# Patient Record
Sex: Female | Born: 1990 | Race: Black or African American | Hispanic: No | Marital: Single | Smoking: Never smoker
Health system: Southern US, Community
[De-identification: ages and names within clinical notes are randomized; demographics above are authoritative.]

## PROBLEM LIST (undated history)

## (undated) ENCOUNTER — Emergency Department (HOSPITAL_BASED_OUTPATIENT_CLINIC_OR_DEPARTMENT_OTHER): Admission: EM | Payer: Self-pay | Source: Home / Self Care

## (undated) DIAGNOSIS — R011 Cardiac murmur, unspecified: Secondary | ICD-10-CM

## (undated) DIAGNOSIS — I10 Essential (primary) hypertension: Secondary | ICD-10-CM

---

## 2013-10-29 ENCOUNTER — Emergency Department (HOSPITAL_BASED_OUTPATIENT_CLINIC_OR_DEPARTMENT_OTHER)
Admission: EM | Admit: 2013-10-29 | Discharge: 2013-10-29 | Disposition: A | Discharge status: Home / Self Care | Payer: Medicaid Other | Attending: Emergency Medicine | Admitting: Emergency Medicine

## 2013-10-29 ENCOUNTER — Encounter (HOSPITAL_BASED_OUTPATIENT_CLINIC_OR_DEPARTMENT_OTHER): Payer: Self-pay | Admitting: Emergency Medicine

## 2013-10-29 DIAGNOSIS — B3731 Acute candidiasis of vulva and vagina: Secondary | ICD-10-CM | POA: Insufficient documentation

## 2013-10-29 DIAGNOSIS — X503XXA Overexertion from repetitive movements, initial encounter: Secondary | ICD-10-CM | POA: Insufficient documentation

## 2013-10-29 DIAGNOSIS — S39012A Strain of muscle, fascia and tendon of lower back, initial encounter: Secondary | ICD-10-CM

## 2013-10-29 DIAGNOSIS — Z3202 Encounter for pregnancy test, result negative: Secondary | ICD-10-CM | POA: Insufficient documentation

## 2013-10-29 DIAGNOSIS — B373 Candidiasis of vulva and vagina: Secondary | ICD-10-CM

## 2013-10-29 DIAGNOSIS — S335XXA Sprain of ligaments of lumbar spine, initial encounter: Secondary | ICD-10-CM | POA: Insufficient documentation

## 2013-10-29 DIAGNOSIS — R011 Cardiac murmur, unspecified: Secondary | ICD-10-CM | POA: Insufficient documentation

## 2013-10-29 DIAGNOSIS — Y929 Unspecified place or not applicable: Secondary | ICD-10-CM | POA: Insufficient documentation

## 2013-10-29 DIAGNOSIS — Y9389 Activity, other specified: Secondary | ICD-10-CM | POA: Insufficient documentation

## 2013-10-29 HISTORY — DX: Cardiac murmur, unspecified: R01.1

## 2013-10-29 LAB — URINALYSIS, ROUTINE W REFLEX MICROSCOPIC
Bilirubin Urine: NEGATIVE
Glucose, UA: NEGATIVE mg/dL
Hgb urine dipstick: NEGATIVE
KETONES UR: NEGATIVE mg/dL
Nitrite: NEGATIVE
PROTEIN: NEGATIVE mg/dL
Specific Gravity, Urine: 1.005 (ref 1.005–1.030)
UROBILINOGEN UA: 1 mg/dL (ref 0.0–1.0)
pH: 6.5 (ref 5.0–8.0)

## 2013-10-29 LAB — WET PREP, GENITAL
TRICH WET PREP: NONE SEEN
Yeast Wet Prep HPF POC: NONE SEEN

## 2013-10-29 LAB — PREGNANCY, URINE: PREG TEST UR: NEGATIVE

## 2013-10-29 LAB — URINE MICROSCOPIC-ADD ON

## 2013-10-29 MED ORDER — CYCLOBENZAPRINE HCL 10 MG PO TABS: 10.0000 mg | ORAL_TABLET | Freq: Every day | ORAL | Status: DC

## 2013-10-29 MED ORDER — FLUCONAZOLE 50 MG PO TABS
150.0000 mg | ORAL_TABLET | Freq: Once | ORAL | Status: AC
  Administered 2013-10-29: 150 mg via ORAL
  Filled 2013-10-29 (×2): qty 1

## 2013-10-29 NOTE — ED Notes (Signed)
Pt also c/o vaginal d/c started yesterday

## 2013-10-29 NOTE — ED Notes (Signed)
Pt with lower back for several months, has been seen at [facility] several times, states they have not done any xrays, denies any paraesthesia to extremities, no weakness or difficulty ambulating  vaginal discharge started yesterday, + itching, no odor, denies unprotected sex, reports new type of soap.
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
## 2013-10-30 LAB — GC/CHLAMYDIA PROBE AMP
CT Probe RNA: NEGATIVE
GC Probe RNA: NEGATIVE

## 2013-12-28 ENCOUNTER — Emergency Department (HOSPITAL_BASED_OUTPATIENT_CLINIC_OR_DEPARTMENT_OTHER)
Admission: EM | Admit: 2013-12-28 | Discharge: 2013-12-28 | Disposition: A | Discharge status: Home / Self Care | Payer: Medicaid Other | Attending: Emergency Medicine | Admitting: Emergency Medicine

## 2013-12-28 ENCOUNTER — Encounter (HOSPITAL_BASED_OUTPATIENT_CLINIC_OR_DEPARTMENT_OTHER): Payer: Self-pay | Admitting: Emergency Medicine

## 2013-12-28 ENCOUNTER — Emergency Department (HOSPITAL_BASED_OUTPATIENT_CLINIC_OR_DEPARTMENT_OTHER): Payer: Medicaid Other

## 2013-12-28 DIAGNOSIS — A499 Bacterial infection, unspecified: Secondary | ICD-10-CM | POA: Insufficient documentation

## 2013-12-28 DIAGNOSIS — O9989 Other specified diseases and conditions complicating pregnancy, childbirth and the puerperium: Secondary | ICD-10-CM | POA: Insufficient documentation

## 2013-12-28 DIAGNOSIS — O2390 Unspecified genitourinary tract infection in pregnancy, unspecified trimester: Secondary | ICD-10-CM | POA: Insufficient documentation

## 2013-12-28 DIAGNOSIS — R011 Cardiac murmur, unspecified: Secondary | ICD-10-CM | POA: Insufficient documentation

## 2013-12-28 DIAGNOSIS — O98819 Other maternal infectious and parasitic diseases complicating pregnancy, unspecified trimester: Secondary | ICD-10-CM | POA: Insufficient documentation

## 2013-12-28 DIAGNOSIS — R109 Unspecified abdominal pain: Secondary | ICD-10-CM

## 2013-12-28 DIAGNOSIS — N831 Corpus luteum cyst of ovary, unspecified side: Secondary | ICD-10-CM | POA: Insufficient documentation

## 2013-12-28 DIAGNOSIS — N760 Acute vaginitis: Secondary | ICD-10-CM | POA: Insufficient documentation

## 2013-12-28 DIAGNOSIS — O26899 Other specified pregnancy related conditions, unspecified trimester: Secondary | ICD-10-CM

## 2013-12-28 DIAGNOSIS — B9689 Other specified bacterial agents as the cause of diseases classified elsewhere: Secondary | ICD-10-CM | POA: Insufficient documentation

## 2013-12-28 DIAGNOSIS — O219 Vomiting of pregnancy, unspecified: Secondary | ICD-10-CM

## 2013-12-28 DIAGNOSIS — O210 Mild hyperemesis gravidarum: Secondary | ICD-10-CM | POA: Insufficient documentation

## 2013-12-28 DIAGNOSIS — M549 Dorsalgia, unspecified: Secondary | ICD-10-CM | POA: Insufficient documentation

## 2013-12-28 LAB — URINALYSIS, ROUTINE W REFLEX MICROSCOPIC
Bilirubin Urine: NEGATIVE
GLUCOSE, UA: NEGATIVE mg/dL
Hgb urine dipstick: NEGATIVE
KETONES UR: 15 mg/dL — AB
Nitrite: NEGATIVE
PROTEIN: NEGATIVE mg/dL
Specific Gravity, Urine: 1.022 (ref 1.005–1.030)
UROBILINOGEN UA: 1 mg/dL (ref 0.0–1.0)
pH: 6 (ref 5.0–8.0)

## 2013-12-28 LAB — CBC WITH DIFFERENTIAL/PLATELET
Basophils Absolute: 0 10*3/uL (ref 0.0–0.1)
Basophils Relative: 0 % (ref 0–1)
Eosinophils Absolute: 0.2 10*3/uL (ref 0.0–0.7)
Eosinophils Relative: 3 % (ref 0–5)
HEMATOCRIT: 38.6 % (ref 36.0–46.0)
HEMOGLOBIN: 13 g/dL (ref 12.0–15.0)
LYMPHS ABS: 3.1 10*3/uL (ref 0.7–4.0)
LYMPHS PCT: 40 % (ref 12–46)
MCH: 28.6 pg (ref 26.0–34.0)
MCHC: 33.7 g/dL (ref 30.0–36.0)
MCV: 84.8 fL (ref 78.0–100.0)
MONO ABS: 0.9 10*3/uL (ref 0.1–1.0)
MONOS PCT: 11 % (ref 3–12)
NEUTROS ABS: 3.5 10*3/uL (ref 1.7–7.7)
NEUTROS PCT: 46 % (ref 43–77)
Platelets: 266 10*3/uL (ref 150–400)
RBC: 4.55 MIL/uL (ref 3.87–5.11)
RDW: 13.2 % (ref 11.5–15.5)
WBC: 7.7 10*3/uL (ref 4.0–10.5)

## 2013-12-28 LAB — URINE MICROSCOPIC-ADD ON

## 2013-12-28 LAB — PREGNANCY, URINE: PREG TEST UR: POSITIVE — AB

## 2013-12-28 LAB — WET PREP, GENITAL: TRICH WET PREP: NONE SEEN

## 2013-12-28 LAB — HCG, QUANTITATIVE, PREGNANCY: hCG, Beta Chain, Quant, S: 34457 m[IU]/mL — ABNORMAL HIGH (ref <5)

## 2013-12-28 MED ORDER — METRONIDAZOLE 500 MG PO TABS: 500.0000 mg | ORAL_TABLET | Freq: Two times a day (BID) | ORAL | Status: DC

## 2013-12-28 MED ORDER — DOXYLAMINE-PYRIDOXINE 10-10 MG PO TBEC: DELAYED_RELEASE_TABLET | ORAL | Status: DC

## 2013-12-28 NOTE — ED Notes (Signed)
Patient states she is having lower abd pain and she need a pregnancy test.

## 2013-12-28 NOTE — Discharge Instructions (Signed)
Your ultrasound today shows that you are 6 weeks and 5 days pregnant. You have a cyst on your right ovary that is most likely the reason for your pain. You can take tylenol as needed. We are treating you for bacterial vaginosis and for your nausea.  STOP THE BIRTH CONTROL PILLS AND START YOUR PRENATAL CARE.

## 2013-12-28 NOTE — ED Provider Notes (Signed)
CSN: [id number]     Arrival date & time 12/28/13  1440 History   First MD Initiated Contact with Patient 12/28/13 1610     Chief Complaint  Patient presents with  . Abdominal Pain     (Consider location/radiation/quality/duration/timing/severity/associated sxs/prior Treatment) HPI Tammy Callahan is a 23 y.o. female G3, P2 who presents to the ED with lower abdominal pain that started about a week ago. LMP 12/03/2013. She has had nausea and vomiting for a week as well. She is not sure if she is pregnant. She is taking OC's for birth control and has not missed any. Current sex partner x 4 months. Last pap smear less than one year and was abnormal. She is scheduled to return April 7th for biopsy. She started the OC's less than one month ago. She has also used condoms. No history of STI's.    Past Medical History  Diagnosis Date  . Heart murmur    History reviewed. No pertinent past surgical history. No family history on file. History  Substance Use Topics  . Smoking status: Never Smoker   . Smokeless tobacco: Not on file  . Alcohol Use: Yes   OB History   Grav Para Term Preterm Abortions TAB SAB Ect Mult Living                 Review of Systems  Constitutional: Negative for fever and chills.  HENT: Negative.   Eyes: Negative for visual disturbance.  Respiratory: Negative for chest tightness and shortness of breath.   Cardiovascular: Negative for chest pain.  Gastrointestinal: Positive for nausea, vomiting and abdominal pain. Negative for diarrhea and constipation.  Genitourinary: Negative for dysuria, urgency, frequency, vaginal bleeding and vaginal discharge.  Musculoskeletal: Positive for back pain. Negative for neck pain.  Skin: Negative for rash.  Neurological: Negative for light-headedness and headaches.  Psychiatric/Behavioral: Negative for confusion. The patient is not nervous/anxious.       Allergies  Review of patient's allergies indicates no known allergies.  Home  Medications   Current Outpatient Rx  Name  Route  Sig  Dispense  Refill  . cyclobenzaprine (FLEXERIL) 10 MG tablet   Oral   Take 1 tablet (10 mg total) by mouth at bedtime.   20 tablet   0    BP 116/61  Pulse 79  Temp(Src) 98.6 F (37 C) (Oral)  Resp 18  Ht 5\' 7"  (1.702 m)  Wt 181 lb (82.101 kg)  BMI 28.34 kg/m2  SpO2 100% Physical Exam  Nursing note and vitals reviewed. Constitutional: She is oriented to person, place, and time. She appears well-developed and well-nourished. No distress.  HENT:  Head: Normocephalic.  Eyes: EOM are normal.  Neck: Neck supple.  Cardiovascular: Normal rate and regular rhythm.   Pulmonary/Chest: Effort normal. She has no wheezes.  Abdominal: Soft. Bowel sounds are normal. There is no tenderness.  Genitourinary:  External genitalia without lesions. Frothy malodorous discharge vaginal vault. No CMT, mild right adnexal tenderness. Uterus slightly enlarged.  Musculoskeletal: Normal range of motion.  Neurological: She is alert and oriented to person, place, and time. No cranial nerve deficit.  Skin: Skin is warm and dry.  Psychiatric: She has a normal mood and affect. Her behavior is normal.   Results for orders placed during the hospital encounter of 12/28/13 (from the past 24 hour(s))  URINALYSIS, ROUTINE W REFLEX MICROSCOPIC     Status: Abnormal   Collection Time    12/28/13  2:54 PM  Result Value Ref Range   Color, Urine YELLOW  YELLOW   APPearance CLEAR  CLEAR   Specific Gravity, Urine 1.022  1.005 - 1.030   pH 6.0  5.0 - 8.0   Glucose, UA NEGATIVE  NEGATIVE mg/dL   Hgb urine dipstick NEGATIVE  NEGATIVE   Bilirubin Urine NEGATIVE  NEGATIVE   Ketones, ur 15 (*) NEGATIVE mg/dL   Protein, ur NEGATIVE  NEGATIVE mg/dL   Urobilinogen, UA 1.0  0.0 - 1.0 mg/dL   Nitrite NEGATIVE  NEGATIVE   Leukocytes, UA MODERATE (*) NEGATIVE  PREGNANCY, URINE     Status: Abnormal   Collection Time    12/28/13  2:54 PM      Result Value Ref Range    Preg Test, Ur POSITIVE (*) NEGATIVE  URINE MICROSCOPIC-ADD ON     Status: Abnormal   Collection Time    12/28/13  2:54 PM      Result Value Ref Range   Squamous Epithelial / LPF MANY (*) RARE   WBC, UA 3-6  <3 WBC/hpf   Bacteria, UA MANY (*) RARE   Urine-Other MUCOUS PRESENT    CBC WITH DIFFERENTIAL     Status: None   Collection Time    12/28/13  3:55 PM      Result Value Ref Range   WBC 7.7  4.0 - 10.5 K/uL   RBC 4.55  3.87 - 5.11 MIL/uL   Hemoglobin 13.0  12.0 - 15.0 g/dL   HCT 16.1  09.6 - 04.5 %   MCV 84.8  78.0 - 100.0 fL   MCH 28.6  26.0 - 34.0 pg   MCHC 33.7  30.0 - 36.0 g/dL   RDW 40.9  81.1 - 91.4 %   Platelets 266  150 - 400 K/uL   Neutrophils Relative % 46  43 - 77 %   Neutro Abs 3.5  1.7 - 7.7 K/uL   Lymphocytes Relative 40  12 - 46 %   Lymphs Abs 3.1  0.7 - 4.0 K/uL   Monocytes Relative 11  3 - 12 %   Monocytes Absolute 0.9  0.1 - 1.0 K/uL   Eosinophils Relative 3  0 - 5 %   Eosinophils Absolute 0.2  0.0 - 0.7 K/uL   Basophils Relative 0  0 - 1 %   Basophils Absolute 0.0  0.0 - 0.1 K/uL  HCG, QUANTITATIVE, PREGNANCY     Status: Abnormal   Collection Time    12/28/13  3:55 PM      Result Value Ref Range   hCG, Beta Chain, Quant, S 34457 (*) <5 mIU/mL  ABO/RH     Status: None   Collection Time    12/28/13  3:55 PM      Result Value Ref Range   ABO/RH(D)       Value: B POS     Performed at [facility]  WET PREP, GENITAL     Status: Abnormal   Collection Time    12/28/13  5:05 PM      Result Value Ref Range   Yeast Wet Prep HPF POC FEW (*) NONE SEEN   Trich, Wet Prep NONE SEEN  NONE SEEN   Clue Cells Wet Prep HPF POC MANY (*) NONE SEEN   WBC, Wet Prep HPF POC TOO NUMEROUS TO COUNT (*) NONE SEEN    US Ob Comp Less 14 Wks  12/28/2013   CLINICAL DATA:  Right adnexal tenderness.  EXAM: OBSTETRIC <14 WK [place]  AND TRANSVAGINAL OB US  TECHNIQUE: Both transabdominal and transvaginal ultrasound examinations were performed for complete evaluation of the  gestation as well as the maternal uterus, adnexal regions, and pelvic cul-de-sac. Transvaginal technique was performed to assess early pregnancy.  COMPARISON:  None.  FINDINGS: Intrauterine gestational sac: Visualized/normal in shape.  Yolk sac:  Visualized  Embryo:  Visualized  Cardiac Activity: Visualized  Heart Rate:  124 bpm  MSD:    mm    w     d  CRL:   8 mm  mm   6 w 5 d                  [place] EDC: 08/18/2014  Maternal uterus/adnexae: Moderate subchorionic hemorrhage. Uterus is retroverted. Right corpus luteal cyst. No adnexal masses. No free fluid.  IMPRESSION: Six week 5 day intrauterine pregnancy. Fetal heart rate 124 beats per min. Moderate-sized subchorionic hemorrhage.   Electronically Signed   By: Charlett Nose M.D.   On: 12/28/2013 17:43   US Ob Transvaginal  12/28/2013   CLINICAL DATA:  Right adnexal tenderness.  EXAM: OBSTETRIC <14 WK [place] AND TRANSVAGINAL OB US  TECHNIQUE: Both transabdominal and transvaginal ultrasound examinations were performed for complete evaluation of the gestation as well as the maternal uterus, adnexal regions, and pelvic cul-de-sac. Transvaginal technique was performed to assess early pregnancy.  COMPARISON:  None.  FINDINGS: Intrauterine gestational sac: Visualized/normal in shape.  Yolk sac:  Visualized  Embryo:  Visualized  Cardiac Activity: Visualized  Heart Rate:  124 bpm  MSD:    mm    w     d  CRL:   8 mm  mm   6 w 5 d                  [place] EDC: 08/18/2014  Maternal uterus/adnexae: Moderate subchorionic hemorrhage. Uterus is retroverted. Right corpus luteal cyst. No adnexal masses. No free fluid.  IMPRESSION: Six week 5 day intrauterine pregnancy. Fetal heart rate 124 beats per min. Moderate-sized subchorionic hemorrhage.   Electronically Signed   By: Charlett Nose M.D.   On: 12/28/2013 17:43    ED Course  Procedures (  MDM  23 y.o. female with abdominal discomfort in early pregnancy. Viable IUP at 6 weeks and 5 days. Patient stable for discharge without signs of  miscarriage. Will treat BV and give patient medication for nausea. She will start her prenatal care. She will go to [facility] for any pregnancy related problems.  Discussed with the patient and all questioned fully answered   Medication List    TAKE these medications       Doxylamine-Pyridoxine 10-10 MG Tbec  Commonly known as:  DICLEGIS  Use as directed     metroNIDAZOLE 500 MG tablet  Commonly known as:  FLAGYL  Take 1 tablet (500 mg total) by mouth 2 (two) times daily.      ASK your doctor about these medications       cyclobenzaprine 10 MG tablet  Commonly known as:  FLEXERIL  Take 1 tablet (10 mg total) by mouth at bedtime.         [facility] Orlene Och, [place] 12/29/13 [id number]

## 2013-12-29 LAB — GC/CHLAMYDIA PROBE AMP
CT PROBE, AMP APTIMA: NEGATIVE
GC PROBE AMP APTIMA: NEGATIVE

## 2013-12-29 LAB — ABO/RH: ABO/RH(D): B POS

## 2013-12-31 NOTE — ED Provider Notes (Signed)
Medical screening examination/treatment/procedure(s) were performed by non-physician practitioner and as supervising physician I was immediately available for consultation/collaboration.   EKG Interpretation None        Charnette Younkin W. Audery Wassenaar, MD 12/31/13 0902 

## 2014-01-27 ENCOUNTER — Encounter (HOSPITAL_COMMUNITY): Payer: Self-pay | Admitting: Obstetrics and Gynecology

## 2014-01-28 ENCOUNTER — Other Ambulatory Visit (HOSPITAL_COMMUNITY): Payer: Self-pay | Admitting: Obstetrics and Gynecology

## 2014-01-28 DIAGNOSIS — Z3682 Encounter for antenatal screening for nuchal translucency: Secondary | ICD-10-CM

## 2014-02-09 ENCOUNTER — Ambulatory Visit (HOSPITAL_COMMUNITY): Admission: RE | Admit: 2014-02-09 | Payer: Medicaid Other | Source: Ambulatory Visit

## 2014-02-09 ENCOUNTER — Other Ambulatory Visit (HOSPITAL_COMMUNITY): Payer: Medicaid Other

## 2014-03-12 ENCOUNTER — Emergency Department (HOSPITAL_BASED_OUTPATIENT_CLINIC_OR_DEPARTMENT_OTHER)
Admission: EM | Admit: 2014-03-12 | Discharge: 2014-03-12 | Disposition: A | Discharge status: Home / Self Care | Payer: Medicaid Other | Attending: Emergency Medicine | Admitting: Emergency Medicine

## 2014-03-12 ENCOUNTER — Encounter (HOSPITAL_BASED_OUTPATIENT_CLINIC_OR_DEPARTMENT_OTHER): Payer: Self-pay | Admitting: Emergency Medicine

## 2014-03-12 DIAGNOSIS — N760 Acute vaginitis: Secondary | ICD-10-CM | POA: Insufficient documentation

## 2014-03-12 DIAGNOSIS — B9689 Other specified bacterial agents as the cause of diseases classified elsewhere: Secondary | ICD-10-CM | POA: Insufficient documentation

## 2014-03-12 DIAGNOSIS — Z792 Long term (current) use of antibiotics: Secondary | ICD-10-CM | POA: Insufficient documentation

## 2014-03-12 DIAGNOSIS — O2390 Unspecified genitourinary tract infection in pregnancy, unspecified trimester: Secondary | ICD-10-CM | POA: Insufficient documentation

## 2014-03-12 DIAGNOSIS — R011 Cardiac murmur, unspecified: Secondary | ICD-10-CM | POA: Insufficient documentation

## 2014-03-12 DIAGNOSIS — A499 Bacterial infection, unspecified: Secondary | ICD-10-CM | POA: Insufficient documentation

## 2014-03-12 DIAGNOSIS — N390 Urinary tract infection, site not specified: Secondary | ICD-10-CM

## 2014-03-12 LAB — URINE MICROSCOPIC-ADD ON

## 2014-03-12 LAB — WET PREP, GENITAL: Trich, Wet Prep: NONE SEEN

## 2014-03-12 LAB — URINALYSIS, ROUTINE W REFLEX MICROSCOPIC
Bilirubin Urine: NEGATIVE
GLUCOSE, UA: NEGATIVE mg/dL
HGB URINE DIPSTICK: NEGATIVE
KETONES UR: 15 mg/dL — AB
Nitrite: NEGATIVE
PROTEIN: NEGATIVE mg/dL
Specific Gravity, Urine: 1.02 (ref 1.005–1.030)
Urobilinogen, UA: 1 mg/dL (ref 0.0–1.0)
pH: 7 (ref 5.0–8.0)

## 2014-03-12 MED ORDER — ACETAMINOPHEN 325 MG PO TABS
650.0000 mg | ORAL_TABLET | Freq: Once | ORAL | Status: AC
  Administered 2014-03-12: 650 mg via ORAL
  Filled 2014-03-12: qty 2

## 2014-03-12 MED ORDER — METRONIDAZOLE 500 MG PO TABS
500.0000 mg | ORAL_TABLET | Freq: Once | ORAL | Status: AC
  Administered 2014-03-12: 500 mg via ORAL
  Filled 2014-03-12: qty 1

## 2014-03-12 MED ORDER — NITROFURANTOIN MONOHYD MACRO 100 MG PO CAPS: 100.0000 mg | ORAL_CAPSULE | Freq: Two times a day (BID) | ORAL | Status: DC

## 2014-03-12 MED ORDER — METRONIDAZOLE 500 MG PO TABS: 500.0000 mg | ORAL_TABLET | Freq: Two times a day (BID) | ORAL | Status: DC

## 2014-03-12 MED ORDER — NITROFURANTOIN MONOHYD MACRO 100 MG PO CAPS
100.0000 mg | ORAL_CAPSULE | Freq: Once | ORAL | Status: AC
  Administered 2014-03-12: 100 mg via ORAL
  Filled 2014-03-12: qty 1

## 2014-03-12 NOTE — ED Provider Notes (Signed)
TIME SEEN: 8:11 PM  CHIEF COMPLAINT: Lower abdominal pain, dysuria  HPI: Patient is a 23 year old G3 P2 who is currently [redacted] weeks pregnant who presents emergency department with 2 days of lower abdominal cramping and dysuria. She denies any fevers, chills, nausea, vomiting or diarrhea. No vaginal bleeding or leaking fluid. No vaginal discharge. She has been feeling her fetus move. She states she is followed by [facility].  ROS: See HPI Constitutional: no fever  Eyes: no drainage  ENT: no runny nose   Cardiovascular:  no chest pain  Resp: no SOB  GI: no vomiting GU: no dysuria Integumentary: no rash  Allergy: no hives  Musculoskeletal: no leg swelling  Neurological: no slurred speech ROS otherwise negative  PAST MEDICAL HISTORY/PAST SURGICAL HISTORY:  Past Medical History  Diagnosis Date  . Heart murmur     MEDICATIONS:  Prior to Admission medications   Medication Sig Start Date End Date Taking? Authorizing Provider  cyclobenzaprine (FLEXERIL) 10 MG tablet Take 1 tablet (10 mg total) by mouth at bedtime. 10/29/13   Gwyneth SproutWhitney Plunkett, MD  Doxylamine-Pyridoxine (DICLEGIS) 10-10 MG TBEC Use as directed 12/28/13   Janne NapoleonHope M Neese, NP  metroNIDAZOLE (FLAGYL) 500 MG tablet Take 1 tablet (500 mg total) by mouth 2 (two) times daily. 12/28/13   Hope Orlene OchM Neese, NP    ALLERGIES:  No Known Allergies  SOCIAL HISTORY:  History  Substance Use Topics  . Smoking status: Never Smoker   . Smokeless tobacco: Not on file  . Alcohol Use: No    FAMILY HISTORY: No family history on file.  EXAM: BP 117/63  Pulse 83  Resp 17  Ht 5\' 7"  (1.702 m)  Wt 180 lb (81.647 kg)  BMI 28.19 kg/m2  SpO2 100%  LMP 12/03/2013 CONSTITUTIONAL: Alert and oriented and responds appropriately to questions. Well-appearing; well-nourished HEAD: Normocephalic EYES: Conjunctivae clear, PERRL ENT: normal nose; no rhinorrhea; moist mucous membranes; pharynx without lesions noted NECK: Supple,  no meningismus, no LAD  CARD: RRR; S1 and S2 appreciated; no murmurs, no clicks, no rubs, no gallops RESP: Normal chest excursion without splinting or tachypnea; breath sounds clear and equal bilaterally; no wheezes, no rhonchi, no rales,  ABD/GI: Normal bowel sounds; non-distended; soft, non-tender, no rebound, no guarding; gravid uterus with fundus palpated below the level of the umbilicus GU: Normal external genitalia, no cervical motion tenderness or adnexal tenderness or fullness, cervix is thick, closed and high, no vaginal bleeding, patient has thick white vaginal discharge BACK:  The back appears normal and is non-tender to palpation, there is no CVA tenderness EXT: Normal ROM in all joints; non-tender to palpation; no edema; normal capillary refill; no cyanosis    SKIN: Normal color for age and race; warm NEURO: Moves all extremities equally PSYCH: The patient's mood and manner are appropriate. Grooming and personal hygiene are appropriate.  MEDICAL DECISION MAKING: Patient here with lower abdominal pain and dysuria. Differential diagnosis includes UTI, threatened miscarriage, less likely appendicitis or colitis given her benign exam. Will obtain urinalysis and pelvic exam with wet prep. Bedside ultrasound shows normal fetal movement and fetal heart rate of 143.  ED PROGRESS: Patient is a urinary tract infection. Urine culture is pending. Will discharge on Macrobid for one week. She also appears to have bacterial vaginosis. We'll treat with Flagyl for one week. Have discussed return precautions and supportive care instructions. Patient verbalizes understanding and is comfortable with plan.    EMERGENCY DEPARTMENT US PREGNANCY "Study: Limited Ultrasound  of the Pelvis for Pregnancy"  INDICATIONS:Pelvic pain Multiple views of the uterus and pelvic cavity were obtained in real-time with a multi-frequency probe.  APPROACH:Transabdominal   PERFORMED BY: Myself  IMAGES ARCHIVED?:  Yes  LIMITATIONS: none  PREGNANCY FREE FLUID: None  ADNEXAL FINDINGS:Left ovary not seen; Right ovary not seen  PREGNANCY FINDINGS: Fetal heart activity seen; normal fetal movement; IUP appreciated  INTERPRETATION: Viable intrauterine pregnancy  FETAL HEART RATE: 143 bpm      Tammy MawKristen N Eilan Mcinerny, DO 03/12/14 2101

## 2014-03-12 NOTE — ED Notes (Signed)
Pt. Reports she has burning with urination .Marland Kitchen.Pt. Reports lower abd. Pain.

## 2014-03-12 NOTE — ED Notes (Signed)
Pt. Is [redacted] wks pregnant with no spotting or bleeding.

## 2014-03-12 NOTE — Discharge Instructions (Signed)
Bacterial Vaginosis °Bacterial vaginosis is a vaginal infection that occurs when the normal balance of bacteria in the vagina is disrupted. It results from an overgrowth of certain bacteria. This is the most common vaginal infection in women of childbearing age. Treatment is important to prevent complications, especially in pregnant women, as it can cause a premature delivery. °CAUSES  °Bacterial vaginosis is caused by an increase in harmful bacteria that are normally present in smaller amounts in the vagina. Several different kinds of bacteria can cause bacterial vaginosis. However, the reason that the condition develops is not fully understood. °RISK FACTORS °Certain activities or behaviors can put you at an increased risk of developing bacterial vaginosis, including: °· Having a new sex partner or multiple sex partners. °· Douching. °· Using an intrauterine device (IUD) for contraception. °Women do not get bacterial vaginosis from toilet seats, bedding, swimming pools, or contact with objects around them. °SIGNS AND SYMPTOMS  °Some women with bacterial vaginosis have no signs or symptoms. Common symptoms include: °· Grey vaginal discharge. °· A fishlike odor with discharge, especially after sexual intercourse. °· Itching or burning of the vagina and vulva. °· Burning or pain with urination. °DIAGNOSIS  °Your health care provider will take a medical history and examine the vagina for signs of bacterial vaginosis. A sample of vaginal fluid may be taken. Your health care provider will look at this sample under a microscope to check for bacteria and abnormal cells. A vaginal pH test may also be done.  °TREATMENT  °Bacterial vaginosis may be treated with antibiotic medicines. These may be given in the form of a pill or a vaginal cream. A second round of antibiotics may be prescribed if the condition comes back after treatment.  °HOME CARE INSTRUCTIONS  °· Only take over-the-counter or prescription medicines as  directed by your health care provider. °· If antibiotic medicine was prescribed, take it as directed. Make sure you finish it even if you start to feel better. °· Do not have sex until treatment is completed. °· Tell all sexual partners that you have a vaginal infection. They should see their health care provider and be treated if they have problems, such as a mild rash or itching. °· Practice safe sex by using condoms and only having one sex partner. °SEEK MEDICAL CARE IF:  °· Your symptoms are not improving after 3 days of treatment. °· You have increased discharge or pain. °· You have a fever. °MAKE SURE YOU:  °· Understand these instructions. °· Will watch your condition. °· Will get help right away if you are not doing well or get worse. °FOR MORE INFORMATION  °Centers for Disease Control and Prevention, Division of STD Prevention: www.cdc.gov/std °American Sexual Health Association (ASHA): www.ashastd.org  °Document Released: 10/09/2005 Document Revised: 07/30/2013 Document Reviewed: 05/21/2013 °ExitCare® Patient Information ©2014 [employer]. °Urinary Tract Infection °Urinary tract infections (UTIs) can develop anywhere along your urinary tract. Your urinary tract is your body's drainage system for removing wastes and extra water. Your urinary tract includes two kidneys, two ureters, a bladder, and a urethra. Your kidneys are a pair of bean-shaped organs. Each kidney is about the size of your fist. They are located below your ribs, one on each side of your spine. °CAUSES °Infections are caused by microbes, which are microscopic organisms, including fungi, viruses, and bacteria. These organisms are so small that they can only be seen through a microscope. Bacteria are the microbes that most commonly cause UTIs. °SYMPTOMS  °Symptoms of UTIs   may vary by age and gender of the patient and by the location of the infection. Symptoms in young women typically include a frequent and intense urge to urinate and a  painful, burning feeling in the bladder or urethra during urination. Older women and men are more likely to be tired, shaky, and weak and have muscle aches and abdominal pain. A fever may mean the infection is in your kidneys. Other symptoms of a kidney infection include pain in your back or sides below the ribs, nausea, and vomiting. °DIAGNOSIS °To diagnose a UTI, your caregiver will ask you about your symptoms. Your caregiver also will ask to provide a urine sample. The urine sample will be tested for bacteria and white blood cells. White blood cells are made by your body to help fight infection. °TREATMENT  °Typically, UTIs can be treated with medication. Because most UTIs are caused by a bacterial infection, they usually can be treated with the use of antibiotics. The choice of antibiotic and length of treatment depend on your symptoms and the type of bacteria causing your infection. °HOME CARE INSTRUCTIONS °· If you were prescribed antibiotics, take them exactly as your caregiver instructs you. Finish the medication even if you feel better after you have only taken some of the medication. °· Drink enough water and fluids to keep your urine clear or pale yellow. °· Avoid caffeine, tea, and carbonated beverages. They tend to irritate your bladder. °· Empty your bladder often. Avoid holding urine for long periods of time. °· Empty your bladder before and after sexual intercourse. °· After a bowel movement, women should cleanse from front to back. Use each tissue only once. °SEEK MEDICAL CARE IF:  °· You have back pain. °· You develop a fever. °· Your symptoms do not begin to resolve within 3 days. °SEEK IMMEDIATE MEDICAL CARE IF:  °· You have severe back pain or lower abdominal pain. °· You develop chills. °· You have nausea or vomiting. °· You have continued burning or discomfort with urination. °MAKE SURE YOU:  °· Understand these instructions. °· Will watch your condition. °· Will get help right away if you are  not doing well or get worse. °Document Released: 07/19/2005 Document Revised: 04/09/2012 Document Reviewed: 11/17/2011 °ExitCare® Patient Information ©2014 [employer]. ° °

## 2014-03-12 NOTE — ED Notes (Signed)
Pt given rx x 2 for macrobid and flagyl

## 2014-03-13 LAB — GC/CHLAMYDIA PROBE AMP
CT PROBE, AMP APTIMA: NEGATIVE
GC PROBE AMP APTIMA: NEGATIVE

## 2014-03-16 LAB — URINE CULTURE
Colony Count: NO GROWTH
Culture: NO GROWTH

## 2014-08-24 ENCOUNTER — Encounter (HOSPITAL_BASED_OUTPATIENT_CLINIC_OR_DEPARTMENT_OTHER): Payer: Self-pay | Admitting: Emergency Medicine

## 2014-12-21 ENCOUNTER — Emergency Department (HOSPITAL_BASED_OUTPATIENT_CLINIC_OR_DEPARTMENT_OTHER)
Admission: EM | Admit: 2014-12-21 | Discharge: 2014-12-21 | Disposition: A | Discharge status: Home / Self Care | Payer: Medicaid Other | Attending: Emergency Medicine | Admitting: Emergency Medicine

## 2014-12-21 ENCOUNTER — Encounter (HOSPITAL_BASED_OUTPATIENT_CLINIC_OR_DEPARTMENT_OTHER): Payer: Self-pay | Admitting: Emergency Medicine

## 2014-12-21 DIAGNOSIS — Z79899 Other long term (current) drug therapy: Secondary | ICD-10-CM | POA: Diagnosis not present

## 2014-12-21 DIAGNOSIS — Z792 Long term (current) use of antibiotics: Secondary | ICD-10-CM | POA: Insufficient documentation

## 2014-12-21 DIAGNOSIS — M545 Low back pain: Secondary | ICD-10-CM | POA: Diagnosis present

## 2014-12-21 DIAGNOSIS — M5440 Lumbago with sciatica, unspecified side: Secondary | ICD-10-CM | POA: Diagnosis not present

## 2014-12-21 DIAGNOSIS — R011 Cardiac murmur, unspecified: Secondary | ICD-10-CM | POA: Insufficient documentation

## 2014-12-21 MED ORDER — TRAMADOL HCL 50 MG PO TABS: 50.0000 mg | ORAL_TABLET | Freq: Four times a day (QID) | ORAL | Status: DC | PRN

## 2014-12-21 NOTE — ED Provider Notes (Signed)
CSN: [id number]     Arrival date & time 12/21/14  1057 History   First MD Initiated Contact with Patient 12/21/14 1114     Chief Complaint  Patient presents with  . Back Pain     (Consider location/radiation/quality/duration/timing/severity/associated sxs/prior Treatment) HPI Comments: Patient is a 24 year old female with no significant past medical history. She presents with complaints of low back pain for the past 6 months. This began while she was pregnant, but has resisted for months after delivery. She reports radiation of her pain to both legs. She denies any bowel or bladder complaints or weakness.  Patient is a 24 y.o. female presenting with back pain. The history is provided by the patient.  Back Pain Location:  Lumbar spine Quality:  Stiffness Stiffness is present:  All day Radiates to:  Does not radiate Pain severity:  Moderate Onset quality:  Gradual Duration:  6 months Timing:  Constant Progression:  Worsening Chronicity:  New   Past Medical History  Diagnosis Date  . Heart murmur    History reviewed. No pertinent past surgical history. No family history on file. History  Substance Use Topics  . Smoking status: Never Smoker   . Smokeless tobacco: Not on file  . Alcohol Use: No   OB History    Gravida Para Term Preterm AB TAB SAB Ectopic Multiple Living   1              Review of Systems  Musculoskeletal: Positive for back pain.  All other systems reviewed and are negative.     Allergies  Review of patient's allergies indicates no known allergies.  Home Medications   Prior to Admission medications   Medication Sig Start Date End Date Taking? Authorizing Provider  cyclobenzaprine (FLEXERIL) 10 MG tablet Take 1 tablet (10 mg total) by mouth at bedtime. 10/29/13   Gwyneth Sprout, MD  Doxylamine-Pyridoxine (DICLEGIS) 10-10 MG TBEC Use as directed 12/28/13   Janne Napoleon, NP  metroNIDAZOLE (FLAGYL) 500 MG tablet Take 1 tablet (500 mg total) by mouth 2  (two) times daily. 12/28/13   Hope Orlene Och, NP  metroNIDAZOLE (FLAGYL) 500 MG tablet Take 1 tablet (500 mg total) by mouth 2 (two) times daily. 03/12/14   Kristen N Ward, DO  nitrofurantoin, macrocrystal-monohydrate, (MACROBID) 100 MG capsule Take 1 capsule (100 mg total) by mouth 2 (two) times daily. 03/12/14   Kristen N Ward, DO   BP 124/89 mmHg  Pulse 94  Temp(Src) 98.7 F (37.1 C)  Resp 18  Ht  (1.702 m)  Wt 162 lb (73.483 kg)  BMI 25.37 kg/m2  SpO2 99%  LMP 12/01/2013 (Exact Date)  Breastfeeding? Unknown Physical Exam  Constitutional: She is oriented to person, place, and time. She appears well-developed and well-nourished. No distress.  HENT:  Head: Normocephalic and atraumatic.  Neck: Normal range of motion. Neck supple.  Musculoskeletal: Normal range of motion.  There is tenderness to palpation in soft tissues of the lumbar region.  Neurological: She is alert and oriented to person, place, and time.  DTRs are trace and symmetrical in the bilateral lower extremities. Strength is 5 out of 5 throughout in the bilateral lower extremities. She is able to ambulate on her heels and toes without difficulty.  Skin: Skin is warm and dry. She is not diaphoretic.  Nursing note and vitals reviewed.   ED Course  Procedures (including critical care time) Labs Review Labs Reviewed - No data to display  Imaging Review No results found.  EKG Interpretation None      MDM   Final diagnoses:  None    Patient presents with a six-month history of low back pain. I see nothing in the physical exam and hear nothing in the history that makes me feel as though there is an emergent process. I will recommend anti-inflammatories, prescribed tramadol, and have the patient follow-up with her primary Dr. to discuss physical therapy or further imaging as an outpatient.    Geoffery Lyonsouglas Lillar Bianca, MD 12/21/14 1146

## 2014-12-21 NOTE — ED Notes (Signed)
24 yo c./o lower back pain since giving birth last August of 2015. Pain in lower back radiates down to both legs. Denies swelling. Pain 10/10 pt is ambulatory at triage.

## 2014-12-21 NOTE — Discharge Instructions (Signed)
Ibuprofen 600 mg every 6 hours as needed for pain. A tramadol as needed for pain not relieved with ibuprofen.  Follow-up with a primary Dr. if not improving in the next week to discuss physical therapy or further imaging.   Back Pain, Adult Back pain is very common. The pain often gets better over time. The cause of back pain is usually not dangerous. Most people can learn to manage their back pain on their own.  HOME CARE   Stay active. Start with short walks on flat ground if you can. Try to walk farther each day.  Do not sit, drive, or stand in one place for more than 30 minutes. Do not stay in bed.  Do not avoid exercise or work. Activity can help your back heal faster.  Be careful when you bend or lift an object. Bend at your knees, keep the object close to you, and do not twist.  Sleep on a firm mattress. Lie on your side, and bend your knees. If you lie on your back, put a pillow under your knees.  Only take medicines as told by your doctor.  Put ice on the injured area.  Put ice in a plastic bag.  Place a towel between your skin and the bag.  Leave the ice on for 15-20 minutes, 03-04 times a day for the first 2 to 3 days. After that, you can switch between ice and heat packs.  Ask your doctor about back exercises or massage.  Avoid feeling anxious or stressed. Find good ways to deal with stress, such as exercise. GET HELP RIGHT AWAY IF:   Your pain does not go away with rest or medicine.  Your pain does not go away in 1 week.  You have new problems.  You do not feel well.  The pain spreads into your legs.  You cannot control when you poop (bowel movement) or pee (urinate).  Your arms or legs feel weak or lose feeling (numbness).  You feel sick to your stomach (nauseous) or throw up (vomit).  You have belly (abdominal) pain.  You feel like you may pass out (faint). MAKE SURE YOU:   Understand these instructions.  Will watch your condition.  Will get  help right away if you are not doing well or get worse. Document Released: 03/27/2008 Document Revised: 01/01/2012 Document Reviewed: 02/10/2014 [facility] CenterExitCare Patient Information 2015 [place], [place]. This information is not intended to replace advice given to you by your health care provider. Make sure you discuss any questions you have with your health care provider.

## 2015-11-28 ENCOUNTER — Emergency Department (HOSPITAL_BASED_OUTPATIENT_CLINIC_OR_DEPARTMENT_OTHER)
Admission: EM | Admit: 2015-11-28 | Discharge: 2015-11-28 | Disposition: A | Discharge status: Home / Self Care | Payer: Medicaid Other | Attending: Emergency Medicine | Admitting: Emergency Medicine

## 2015-11-28 ENCOUNTER — Encounter (HOSPITAL_BASED_OUTPATIENT_CLINIC_OR_DEPARTMENT_OTHER): Payer: Self-pay | Admitting: *Deleted

## 2015-11-28 DIAGNOSIS — Z79899 Other long term (current) drug therapy: Secondary | ICD-10-CM | POA: Insufficient documentation

## 2015-11-28 DIAGNOSIS — Z791 Long term (current) use of non-steroidal anti-inflammatories (NSAID): Secondary | ICD-10-CM | POA: Insufficient documentation

## 2015-11-28 DIAGNOSIS — J029 Acute pharyngitis, unspecified: Secondary | ICD-10-CM | POA: Insufficient documentation

## 2015-11-28 DIAGNOSIS — R011 Cardiac murmur, unspecified: Secondary | ICD-10-CM | POA: Insufficient documentation

## 2015-11-28 DIAGNOSIS — H9202 Otalgia, left ear: Secondary | ICD-10-CM | POA: Insufficient documentation

## 2015-11-28 LAB — RAPID STREP SCREEN (MED CTR MEBANE ONLY): STREPTOCOCCUS, GROUP A SCREEN (DIRECT): NEGATIVE

## 2015-11-28 MED ORDER — PREDNISONE 50 MG PO TABS
60.0000 mg | ORAL_TABLET | Freq: Once | ORAL | Status: AC
  Administered 2015-11-28: 60 mg via ORAL
  Filled 2015-11-28: qty 1

## 2015-11-28 NOTE — Discharge Instructions (Signed)
Pharyngitis °Pharyngitis is redness, pain, and swelling (inflammation) of your pharynx.  °CAUSES  °Pharyngitis is usually caused by infection. Most of the time, these infections are from viruses (viral) and are part of a cold. However, sometimes pharyngitis is caused by bacteria (bacterial). Pharyngitis can also be caused by allergies. Viral pharyngitis may be spread from person to person by coughing, sneezing, and personal items or utensils (cups, forks, spoons, toothbrushes). Bacterial pharyngitis may be spread from person to person by more intimate contact, such as kissing.  °SIGNS AND SYMPTOMS  °Symptoms of pharyngitis include:   °· Sore throat.   °· Tiredness (fatigue).   °· Low-grade fever.   °· Headache. °· Joint pain and muscle aches. °· Skin rashes. °· Swollen lymph nodes. °· Plaque-like film on throat or tonsils (often seen with bacterial pharyngitis). °DIAGNOSIS  °Your health care provider will ask you questions about your illness and your symptoms. Your medical history, along with a physical exam, is often all that is needed to diagnose pharyngitis. Sometimes, a rapid strep test is done. Other lab tests may also be done, depending on the suspected cause.  °TREATMENT  °Viral pharyngitis will usually get better in 3-4 days without the use of medicine. Bacterial pharyngitis is treated with medicines that kill germs (antibiotics).  °HOME CARE INSTRUCTIONS  °· Drink enough water and fluids to keep your urine clear or pale yellow.   °· Only take over-the-counter or prescription medicines as directed by your health care provider:   °· If you are prescribed antibiotics, make sure you finish them even if you start to feel better.   °· Do not take aspirin.   °· Get lots of rest.   °· Gargle with 8 oz of salt water (½ tsp of salt per 1 qt of water) as often as every 1-2 hours to soothe your throat.   °· Throat lozenges (if you are not at risk for choking) or sprays may be used to soothe your throat. °SEEK MEDICAL  CARE IF:  °· You have large, tender lumps in your neck. °· You have a rash. °· You cough up green, yellow-[place], or bloody spit. °SEEK IMMEDIATE MEDICAL CARE IF:  °· Your neck becomes stiff. °· You drool or are unable to swallow liquids. °· You vomit or are unable to keep medicines or liquids down. °· You have severe pain that does not go away with the use of recommended medicines. °· You have trouble breathing (not caused by a stuffy nose). °MAKE SURE YOU:  °· Understand these instructions. °· Will watch your condition. °· Will get help right away if you are not doing well or get worse. °  °This information is not intended to replace advice given to you by your health care provider. Make sure you discuss any questions you have with your health care provider. °  °Document Released: 10/09/2005 Document Revised: 07/30/2013 Document Reviewed: 06/16/2013 °Elsevier Interactive Patient Education ©2016 Elsevier Inc. ° °Sore Throat °A sore throat is a painful, burning, sore, or scratchy feeling of the throat. There may be pain or tenderness when swallowing or talking. You may have other symptoms with a sore throat. These include coughing, sneezing, fever, or a swollen neck. A sore throat is often the first sign of another sickness. These sicknesses may include a cold, flu, strep throat, or an infection called mono. Most sore throats go away without medical treatment.  °HOME CARE  °· Only take medicine as told by your doctor. °· Drink enough fluids to keep your pee (urine) clear or pale yellow. °·   Rest as needed. °· Try using throat sprays, lozenges, or suck on hard candy (if older than 4 years or as told). °· Sip warm liquids, such as broth, herbal tea, or warm water with honey. Try sucking on frozen ice pops or drinking cold liquids. °· Rinse the mouth (gargle) with salt water. Mix 1 teaspoon salt with 8 ounces of water. °· Do not smoke. Avoid being around others when they are smoking. °· Put a humidifier in your bedroom  at night to moisten the air. You can also turn on a hot shower and sit in the bathroom for 5-10 minutes. Be sure the bathroom door is closed. °GET HELP RIGHT AWAY IF:  °· You have trouble breathing. °· You cannot swallow fluids, soft foods, or your spit (saliva). °· You have more puffiness (swelling) in the throat. °· Your sore throat does not get better in 7 days. °· You feel sick to your stomach (nauseous) and throw up (vomit). °· You have a fever or lasting symptoms for more than 2-3 days. °· You have a fever and your symptoms suddenly get worse. °MAKE SURE YOU:  °· Understand these instructions. °· Will watch your condition. °· Will get help right away if you are not doing well or get worse. °  °This information is not intended to replace advice given to you by your health care provider. Make sure you discuss any questions you have with your health care provider. °  °Document Released: 07/18/2008 Document Revised: 07/03/2012 Document Reviewed: 06/16/2012 °Elsevier Interactive Patient Education ©2016 Elsevier Inc. ° °

## 2015-11-28 NOTE — ED Provider Notes (Signed)
CSN: [id number]     Arrival date & time 11/28/15  8295 History   First MD Initiated Contact with Patient 11/28/15 [phone]     Chief Complaint  Patient presents with  . Sore Throat      HPI  Patient presents for evaluation of a sore throat and left ear pain for the past several days. No nausea vomiting diarrhea. Minimal cough. Mom states left lateral throat and left ear feel painful. Painful swallowing. No fever. No right-sided symptoms. No neck pain. No cough. No nausea vomiting diarrhea myalgias body aches or other symptoms. Is here with her 43-month-old who has fever cough and wheezing.  Past Medical History  Diagnosis Date  . Heart murmur    History reviewed. No pertinent past surgical history. No family history on file. Social History  Substance Use Topics  . Smoking status: Never Smoker   . Smokeless tobacco: None  . Alcohol Use: No   OB History    Gravida Para Term Preterm AB TAB SAB Ectopic Multiple Living   1              Review of Systems  Constitutional: Negative for fever, chills, diaphoresis, appetite change and fatigue.  HENT: Positive for ear pain and sore throat. Negative for mouth sores and trouble swallowing.   Eyes: Negative for visual disturbance.  Respiratory: Negative for cough, chest tightness, shortness of breath and wheezing.   Cardiovascular: Negative for chest pain.  Gastrointestinal: Negative for nausea, vomiting, abdominal pain, diarrhea and abdominal distention.  Endocrine: Negative for polydipsia, polyphagia and polyuria.  Genitourinary: Negative for dysuria, frequency and hematuria.  Musculoskeletal: Negative for gait problem.  Skin: Negative for color change, pallor and rash.  Neurological: Negative for dizziness, syncope, light-headedness and headaches.  Hematological: Does not bruise/bleed easily.  Psychiatric/Behavioral: Negative for behavioral problems and confusion.      Allergies  Review of patient's allergies indicates no known  allergies.  Home Medications   Prior to Admission medications   Medication Sig Start Date End Date Taking? Authorizing Provider  cyclobenzaprine (FLEXERIL) 10 MG tablet Take 1 tablet (10 mg total) by mouth at bedtime. 10/29/13   Gwyneth Sprout, MD  Doxylamine-Pyridoxine (DICLEGIS) 10-10 MG TBEC Use as directed 12/28/13   Janne Napoleon, NP  metroNIDAZOLE (FLAGYL) 500 MG tablet Take 1 tablet (500 mg total) by mouth 2 (two) times daily. 12/28/13   Hope Orlene Och, NP  metroNIDAZOLE (FLAGYL) 500 MG tablet Take 1 tablet (500 mg total) by mouth 2 (two) times daily. 03/12/14   Kristen N Ward, DO  nitrofurantoin, macrocrystal-monohydrate, (MACROBID) 100 MG capsule Take 1 capsule (100 mg total) by mouth 2 (two) times daily. 03/12/14   Kristen N Ward, DO  traMADol (ULTRAM) 50 MG tablet Take 1 tablet (50 mg total) by mouth every 6 (six) hours as needed. 12/21/14   Geoffery Lyons, MD   BP 135/87 mmHg  Pulse 78  Temp(Src) 98.1 F (36.7 C) (Oral)  Resp 18  Ht  (1.676 m)  Wt 176 lb (79.833 kg)  BMI 28.42 kg/m2  SpO2 99% Physical Exam  Constitutional: She is oriented to person, place, and time. She appears well-developed and well-nourished. No distress.  HENT:  Head: Normocephalic.  Eyes: Conjunctivae are normal. Pupils are equal, round, and reactive to light. No scleral icterus.  Neck: Normal range of motion. Neck supple. No thyromegaly present.  Cardiovascular: Normal rate and regular rhythm.  Exam reveals no gallop and no friction rub.   No murmur heard.  Pulmonary/Chest: Effort normal and breath sounds normal. No respiratory distress. She has no wheezes. She has no rales.  Abdominal: Soft. Bowel sounds are normal. She exhibits no distension. There is no tenderness. There is no rebound.  Musculoskeletal: Normal range of motion.  Neurological: She is alert and oriented to person, place, and time.  Skin: Skin is warm and dry. No rash noted.  Psychiatric: She has a normal mood and affect. Her behavior is  normal.   pharynx, and left TM appear normal. No asymmetry the pharynx. No erythema of the pharynx or tongue. No adenopathy in the neck.  ED Course  Procedures (including critical care time) Labs Review Labs Reviewed  RAPID STREP SCREEN (NOT AT [facility])  CULTURE, GROUP A STREP [facility])    Imaging Review No results found. I have personally reviewed and evaluated these images and lab results as part of my medical decision-making.   EKG Interpretation None      MDM   Final diagnoses:  Pharyngitis    Strep swab requested.  Negative strep.  Given Prednisone.  Plan expectant management.    Rolland Porter, MD 11/28/15 1019

## 2015-11-28 NOTE — ED Notes (Signed)
Patient c/o productive cough/sore throat for the  past week, no n/v/d

## 2015-12-01 LAB — CULTURE, GROUP A STREP (THRC)

## 2017-01-17 ENCOUNTER — Encounter (HOSPITAL_BASED_OUTPATIENT_CLINIC_OR_DEPARTMENT_OTHER): Payer: Self-pay

## 2017-01-17 ENCOUNTER — Emergency Department (HOSPITAL_BASED_OUTPATIENT_CLINIC_OR_DEPARTMENT_OTHER)
Admission: EM | Admit: 2017-01-17 | Discharge: 2017-01-17 | Disposition: A | Discharge status: Home / Self Care | Payer: Medicaid Other | Attending: Emergency Medicine | Admitting: Emergency Medicine

## 2017-01-17 DIAGNOSIS — R112 Nausea with vomiting, unspecified: Secondary | ICD-10-CM

## 2017-01-17 DIAGNOSIS — K5909 Other constipation: Secondary | ICD-10-CM | POA: Diagnosis not present

## 2017-01-17 LAB — URINALYSIS, MICROSCOPIC (REFLEX): RBC / HPF: NONE SEEN RBC/hpf (ref 0–5)

## 2017-01-17 LAB — URINALYSIS, ROUTINE W REFLEX MICROSCOPIC
Bilirubin Urine: NEGATIVE
Glucose, UA: NEGATIVE mg/dL
HGB URINE DIPSTICK: NEGATIVE
Ketones, ur: NEGATIVE mg/dL
Nitrite: NEGATIVE
Protein, ur: NEGATIVE mg/dL
SPECIFIC GRAVITY, URINE: 1.02 (ref 1.005–1.030)
pH: 6 (ref 5.0–8.0)

## 2017-01-17 LAB — PREGNANCY, URINE: PREG TEST UR: NEGATIVE

## 2017-01-17 NOTE — ED Provider Notes (Signed)
[facility] Provider Note   CSN: [id number] Arrival date & time: 01/17/17  1806  By signing my name below, I, Bing Neighbors., attest that this documentation has been prepared under the direction and in the presence of Arby Barrette, MD. Electronically signed: Bing Neighbors., ED Scribe. 01/17/17. 9:52 PM.   History   Chief Complaint Chief Complaint  Patient presents with  . Emesis    HPI  Tammy Callahan is a 26 y.o. female who presents to the Emergency Department complaining of mild abdominal pain with onset x3 hours. Pt states that she ate mussels and Ramen noodles and became overheated and started having abdominal pain then started vomiting. After emesis she states that she began dry heaving. She states that after vomiting her symptoms have subsided. Pt also reports that she has been chronically constipated for the past x3 weeks. Pt reports abdominal distention. She denies any modifying factors. Pt denies chills, fever and abdominal pain at the moment.   The history is provided by the patient. No language interpreter was used.    Past Medical History:  Diagnosis Date  . Heart murmur     There are no active problems to display for this patient.   History reviewed. No pertinent surgical history.  OB History    Gravida Para Term Preterm AB Living   1             SAB TAB Ectopic Multiple Live Births                   Home Medications    Prior to Admission medications   Not on File    Family History No family history on file.  Social History Social History  Substance Use Topics  . Smoking status: Never Smoker  . Smokeless tobacco: Never Used  . Alcohol use Yes     Comment: occ     Allergies   Patient has no known allergies.   Review of Systems Review of Systems  Constitutional: Negative for chills and fever.  Gastrointestinal: Positive for abdominal distention and constipation. Negative for nausea and vomiting.     A complete 10 system review of systems was obtained and all systems are negative except as noted in the HPI and PMH.   Physical Exam Updated Vital Signs BP 122/77 (BP Location: Right Arm)   Pulse 90   Temp 98.1 F (36.7 C) (Oral)   Resp 16   Ht 5\' 7"  (1.702 m)   Wt 188 lb (85.3 kg)   SpO2 100%   BMI 29.44 kg/m   Physical Exam  Constitutional: She appears well-developed and well-nourished. No distress.  HENT:  Head: Normocephalic and atraumatic.  Eyes: Conjunctivae are normal.  Neck: Neck supple.  Cardiovascular: Normal rate and regular rhythm.   No murmur heard. Pulmonary/Chest: Effort normal and breath sounds normal. No respiratory distress.  Abdominal: Soft. There is no tenderness.  Musculoskeletal: She exhibits no edema.  Neurological: She is alert.  Skin: Skin is warm and dry.  Psychiatric: She has a normal mood and affect.  Nursing note and vitals reviewed.    ED Treatments / Results   DIAGNOSTIC STUDIES: Oxygen Saturation is 100% on RA, normal by my interpretation.   COORDINATION OF CARE: 9:52 PM-Discussed next steps with pt. Pt verbalized understanding and is agreeable with the plan.    Labs (all labs ordered are listed, but only abnormal results are displayed) Labs Reviewed  URINALYSIS, ROUTINE W REFLEX MICROSCOPIC -  Abnormal; Notable for the following:       Result Value   APPearance CLOUDY (*)    Leukocytes, UA SMALL (*)    All other components within normal limits  URINALYSIS, MICROSCOPIC (REFLEX) - Abnormal; Notable for the following:    Bacteria, UA RARE (*)    Squamous Epithelial / LPF 6-30 (*)    All other components within normal limits  PREGNANCY, URINE    EKG  EKG Interpretation None       Radiology No results found.  Procedures Procedures (including critical care time)  Medications Ordered in ED Medications - No data to display   Initial Impression / Assessment and Plan / ED Course  I have reviewed the triage vital  signs and the nursing notes.  Pertinent labs & imaging results that were available during my care of the patient were reviewed by me and considered in my medical decision making (see chart for details).      Final Clinical Impressions(s) / ED Diagnoses   Final diagnoses:  Non-intractable vomiting with nausea, unspecified vomiting type  Chronic constipation  Patient had acute onset of vomiting after eating frozen mussels that she prepared at home and some Ramen noodles. He did not have associated symptoms. Once the vomiting had resolved all symptoms resolved with it. She has no residual symptomology. At this time she is safe for discharge. She describes chronic constipation since her youth. He does not have associated pain but only very decreased frequency of bowel movement. Patient is given instructions on high fiber diet.  New Prescriptions New Prescriptions   No medications on file       Arby BarretteMarcy Dacian Orrico, MD 01/17/17 2153

## 2017-01-17 NOTE — ED Triage Notes (Signed)
c/o n/v x 1 hour after eating mussels- NAD-steady gait

## 2019-08-01 ENCOUNTER — Other Ambulatory Visit: Payer: Self-pay

## 2019-08-01 ENCOUNTER — Emergency Department (HOSPITAL_BASED_OUTPATIENT_CLINIC_OR_DEPARTMENT_OTHER)
Admission: EM | Admit: 2019-08-01 | Discharge: 2019-08-01 | Disposition: A | Discharge status: Home / Self Care | Payer: Medicaid Other | Attending: Emergency Medicine | Admitting: Emergency Medicine

## 2019-08-01 ENCOUNTER — Encounter (HOSPITAL_BASED_OUTPATIENT_CLINIC_OR_DEPARTMENT_OTHER): Payer: Self-pay | Admitting: *Deleted

## 2019-08-01 ENCOUNTER — Emergency Department (HOSPITAL_BASED_OUTPATIENT_CLINIC_OR_DEPARTMENT_OTHER): Payer: Medicaid Other

## 2019-08-01 DIAGNOSIS — M25511 Pain in right shoulder: Secondary | ICD-10-CM | POA: Diagnosis not present

## 2019-08-01 DIAGNOSIS — R011 Cardiac murmur, unspecified: Secondary | ICD-10-CM | POA: Insufficient documentation

## 2019-08-01 DIAGNOSIS — M542 Cervicalgia: Secondary | ICD-10-CM | POA: Insufficient documentation

## 2019-08-01 MED ORDER — METHOCARBAMOL 500 MG PO TABS: 500.0000 mg | ORAL_TABLET | Freq: Three times a day (TID) | ORAL | 0 refills | Status: AC | PRN

## 2019-08-01 MED ORDER — NAPROXEN 500 MG PO TABS: 500.0000 mg | ORAL_TABLET | Freq: Two times a day (BID) | ORAL | 0 refills | Status: AC
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
## 2020-10-25 LAB — HM PAP SMEAR: PAP Smear, External: NEGATIVE

## 2020-10-26 LAB — RPR: RPR: NONREACTIVE

## 2020-10-26 LAB — MONITOR 14-DRUG CLASS PROFILE (LABCORP MEDWATCH)
Amphetamine Screen, Ur: NEGATIVE (ng/mL)
BUPRENORPHINE, URINE: NEGATIVE (ng/mL)
Barbiturate Screen, Ur: NEGATIVE (ng/mL)
Benzodiazepine Screen, Urine: NEGATIVE (ng/mL)
Cannabinoid Scrn, Ur: NEGATIVE (ng/mL)
Cocaine Metabolite Screen, Urine: NEGATIVE (ng/mL)
Creatinine, Ur: 77 (mg/dL) (ref 20.0–300.0)
Fentanyl, Ur: NEGATIVE (pg/mL)
MEPERIDINE SCREEN, URINE: NEGATIVE (ng/mL)
Methadone Screen, Urine: NEGATIVE (ng/mL)
Opiate Screen, Urine: NEGATIVE (ng/mL)
Oxycodone/Oxymorphone, Urine: NEGATIVE (ng/mL)
PCP Screen, Urine: NEGATIVE (ng/mL)
Propoxyphene Screen, Urine: NEGATIVE (ng/mL)
Specific Gravity, Urine: 1.021
Tramadol Screen, Urine: NEGATIVE (ng/mL)
pH, Urine: 7.8 (ref 4.5–8.9)

## 2020-10-26 LAB — HEPATITIS C ANTIBODY: Hep C Virus Ab: NONREACTIVE

## 2020-10-26 LAB — CBC
Hematocrit: 35.5 % (ref 34.0–47.0)
Hemoglobin: 12 g/dL (ref 11.5–15.7)
MCH: 28.4 pg (ref 27.0–34.5)
MCHC: 33.8 g/dL (ref 32.0–36.0)
MCV: 84.1 fL (ref 81.0–99.0)
MPV: 10.2 fL (ref 7.2–13.2)
NRBC Absolute: 0 10*3/uL (ref 0.000–0.012)
NRBC Automated: 0 % (ref 0.0–0.2)
Platelets: 317 10*3/uL (ref 140–440)
RBC: 4.22 x10e6/mcL (ref 3.60–5.20)
RDW: 13.1 % (ref 11.0–16.0)
WBC: 9 10*3/uL (ref 3.8–10.6)

## 2020-10-26 LAB — ABO/RH: ABO/Rh: O POS

## 2020-10-26 LAB — RUBELLA ANTIBODY, IGG
Rubella IgG Scr Interp: REACTIVE
Rubella IgG Scr: 464 IU/mL

## 2020-10-26 LAB — ANTIBODY SCREEN: Antibody Screen: NEGATIVE

## 2020-10-26 LAB — HEMOGLOBIN A1C
Est. Avg. Glucose, WB: 103
Est. Avg. Glucose-calculated: 108

## 2020-10-26 LAB — HIV-1/2 COMBO ANTIGEN/ANTIBODY BY CIA REFLEX PANEL: HIV AG/AB, 4TH GEN: NONREACTIVE

## 2020-10-26 LAB — HBSAG: Hepatitis B Surface Ag: NONREACTIVE

## 2020-10-27 LAB — PAP IG, CT-NG-TV, RFX APTIMA HPV ASCUS (199325)
.: 0
Chlamydia trachomatis, NAA: NEGATIVE
Neisseria Gonorrhoeae, NAA: NEGATIVE
Trichomonas Vaginalis by NAA: NEGATIVE

## 2020-10-28 LAB — CULTURE, URINE: FINAL REPORT: >100000

## 2020-11-05 LAB — HM PAP SMEAR: PAP Smear, External: NEGATIVE

## 2020-12-10 LAB — MATERNAL SERUM SCREEN QUAD
AFP Mom: 1.16
AFP Value: 50.3 ng/mL
Alpha Fetoprotein: NEGATIVE
DIA MOM: 0.62
DIA Mom Value: 114.59 pg/mL
DSR (By Age)    1 IN: 553
DSR (Second Trimester) 1 IN: 10000
GESTATIONAL DATE: 16.6 weeks
HCG Value: 43668 m[IU]/mL
Maternal Age At EDD: 31.7
Maternal Weight: 117 lb
MoM for hCG: 1.01
OSBR Risk: 7366
T18 (By Age): 1:2153 {titer}
uE3 MoM: 1.17
uE3 Value: 1.38 ng/mL

## 2021-03-03 LAB — CBC
Hematocrit: 32.2 % — ABNORMAL LOW (ref 34.0–47.0)
Hemoglobin: 10.9 g/dL — ABNORMAL LOW (ref 11.5–15.7)
MCH: 29.5 pg (ref 27.0–34.5)
MCHC: 33.9 g/dL (ref 32.0–36.0)
MCV: 87.3 fL (ref 81.0–99.0)
MPV: 10.3 fL (ref 7.2–13.2)
NRBC Absolute: 0 10*3/uL (ref 0.000–0.012)
NRBC Automated: 0 % (ref 0.0–0.2)
Platelets: 200 10*3/uL (ref 140–440)
RBC: 3.69 x10e6/mcL (ref 3.60–5.20)
RDW: 13.5 % (ref 11.0–16.0)
WBC: 7.6 10*3/uL (ref 3.8–10.6)

## 2021-03-03 LAB — GLUCOSE 1 HOUR POST GLUCOLA OB

## 2021-03-09 LAB — GLUCOSE TOLERANCE, 2 HOURS: Glucose, 2 hour: 152 mg/dL (ref 70–155)

## 2021-03-09 LAB — GLUCOSE TOLERANCE, 3 HOURS: Glucose, 3 hour: 101 mg/dL (ref 70–140)

## 2021-03-09 LAB — GLUCOSE, FASTING

## 2021-03-09 LAB — GLUCOSE TOLERANCE, 1 HOUR: Glucose, 1 hour: 195 mg/dL — ABNORMAL HIGH (ref 70–180)

## 2021-04-18 LAB — CBC
Hematocrit: 30.3 % — ABNORMAL LOW (ref 34.0–47.0)
Hemoglobin: 9.9 g/dL — ABNORMAL LOW (ref 11.5–15.7)
MCH: 27.7 pg (ref 27.0–34.5)
MCHC: 32.7 g/dL (ref 32.0–36.0)
MCV: 84.6 fL (ref 81.0–99.0)
MPV: 10.8 fL (ref 7.2–13.2)
NRBC Absolute: 0 10*3/uL (ref 0.000–0.012)
NRBC Automated: 0 % (ref 0.0–0.2)
Platelets: 222 10*3/uL (ref 140–440)
RBC: 3.58 x10e6/mcL — ABNORMAL LOW (ref 3.60–5.20)
RDW: 13.4 % (ref 11.0–16.0)
WBC: 7.9 10*3/uL (ref 3.8–10.6)

## 2021-04-21 LAB — GROUP B STREP BY NAA: Strep Group B NAA: POSITIVE — AB

## 2021-05-01 LAB — CBC
Hematocrit: 29.4 % — ABNORMAL LOW (ref 34.0–47.0)
Hemoglobin: 10 g/dL — ABNORMAL LOW (ref 11.5–15.7)
MCH: 28.5 pg (ref 27.0–34.5)
MCHC: 34 g/dL (ref 32.0–36.0)
MCV: 83.8 fL (ref 81.0–99.0)
MPV: 10.3 fL (ref 7.2–13.2)
Platelets: 176 10*3/uL (ref 140–440)
RBC: 3.51 x10e6/mcL — ABNORMAL LOW (ref 3.60–5.20)
RDW: 13.7 % (ref 11.0–16.0)
WBC: 14.8 10*3/uL — ABNORMAL HIGH (ref 3.8–10.6)

## 2021-05-01 LAB — ABO/RH: ABO/Rh: O POS

## 2021-05-01 LAB — ANTIBODY SCREEN: Antibody Screen: NEGATIVE

## 2021-05-01 NOTE — Progress Notes (Signed)
Pharmacy Clinical Interventions - Text       Pharmacy Clinical Interventions Entered On:  05/01/2021 16:40 EDT    Performed On:  05/01/2021 16:40 EDT by Ludmer, Pharm. D., Sara               Interventions   Intervention Type Pharmacy :   Optimize monitoring   Associated Order(s) Pharmacy :   Ancef 2 g wt < 120 kg   Clinical Importance Pharmacy :   Potentially minor   Medication Safety Reporting Pharmacy :   Non Medication Event   Pharmacist Intervention Time :   1-5 Minutes   Ludmer, Pharm. D., [place] Dec - 05/01/2021 16:40 EDT

## 2021-05-01 NOTE — Anesthesia Pre-Procedure Evaluation (Signed)
Preanesthesia Evaluation TP        Patient:   Patty Tran, Patty Tran [facility]            MRN: [id number]            FIN: [id number]               Age:   30 years     Sex:  Female     DOB:  06/07/1991   Associated Diagnoses:   None   Author:   Golden Circle C-MD      Preoperative Information   NPO:  NPO greater than 8 hours.    Anesthesia history     Patient's history: negative.     Family's history: negative.        Health Status   Allergies:    Allergic Reactions (Selected)  No Known Medication Allergies,    Allergies    (Active and Proposed Allergies Only)  No Known Medication Allergies   (Severity: Unknown severity, Onset: Unknown)     Current medications:    Home Medications (2) Active  ferrous sulfate 325 mg, Oral, TID  Prenatal 1   ,    Medications (33) Active  Scheduled: (10)  acetaminophen 325 mg Tab  650 mg 2 tabs, Oral, On Call  AMPICILLIN 1GM/100ML NS ADM  1 g 100 mL, IV Piggyback, q4hr-INT  AMPICILLIN 2 GM/100 ML NS ADM  2 g 100 mL, IV Piggyback, Once  atropine-diphenoxylate 0.025 mg-2.5 mg Tab  2 tabs, Oral, On Call  atropine-diphenoxylate 0.025 mg-2.5 mg Tab  1 tabs, Oral, On Call  azithromycin 500mg /283ml NS ADM  500 mg 250 mL, IV Piggyback, On Call  ceFAZolin duplex  2 g 50 mL, IV Piggyback, On Call  oxytocin 30 units + premix sodium chloride 0.9%..... 500 mL  500 mL, IV Bolus  promethazine 25 mg/mL Inj Soln 1 mL  50 mg 2 mL, IM, On Call  Sodium Chloride 0.9% intravenous solution 1,000 mL  1,000 mL, IV  Continuous: (4)  bupivacaine-fentaNYL 0.125%-2 mcg/mL-NaCl 0.9% 100 mL  100 mL, Epidural, 12 mL/hr  Dextrose 5% with 0.9% NaCl intravenous solution 1,000 mL  1,000 mL, IV, 125 mL/hr  oxytocin 30 units + premix sodium chloride 0.9%..... 500 mL  500 mL, IV  Sodium Chloride 0.9% intravenous solution 1,000 mL  1,000 mL, IV Bolus, 999 mL/hr  PRN: (19)  acetaminophen 325 mg Tab  650 mg 2 tabs, Oral, q4hr  acetaminophen 500 mg Tab  1,000 mg 2 tabs, Oral, q6hr  acetaminophen 500 mg Tab  1,000 mg 2 tabs, Oral, On  Call  ammonia aromatic Solution  1 EA, Inhale, On Call  carboprost 250 mcg/mL Inj Soln 1 mL  250 mcg 1 mL, IM, On Call  ePHEDrine 50 mg/mL Inj Soln 1 mL  5 mg 0.1 mL, IV Push, q57min  lidocaine 1% Inj Soln 50 mL  50 mL, IM, On Call  methylergonovine 0.2 mg Tab  0.2 mg 1 tabs, Oral, q4hr  methylergonovine 0.2 mg/mL Inj Soln 1 mL  0.2 mg 1 mL, IM, On Call  mineral oil Oral Liquid 30 mL  30 mL, Topical, On Call  miSOPROStol  200 mcg Tab  800 mcg 4 tabs, PR, On Call  nalbuphine 10 mg/mL Inj Soln 1 mL  5 mg 0.5 mL, IV Push, q3hr  NIFEdipine 10 mg Cap  10 mg 1 caps, Oral, On Call  ondansetron 2 mg/mL Inj Soln 2 mL  4 mg 2 mL, IV Push,  q6hr  oxytocin 10 units/mL Inj Soln 1 mL  10 units 1 mL, IM, On Call  phenylephrine  100 mcg, IV Push, q19min  silver nitrate Stick  1 app, Topical, On Call  Sodium Chloride 0.9% intravenous solution Bolus  1,000 mL, IV Bolus, On Call  tranexamic acid 1000mg / 100 mL NS ADM + Premix ADM sodium chloride inj... Marland KitchenMarland Kitchen 100 mL  1,000 mg 100 mL, IV Piggyback, On Call     Problem list:    Active Problems (2)  Group B streptococcus   Pregnant   ,    Problems   (Active Problems Only)    Pregnant   (SNOMED CT: Marland Kitchen, Onset: 11/09/20)  Group B streptococcus   (SNOMED CT: 11/11/20, Onset: --)        Histories   Past Medical History:    Resolved  Pregnant ([id number]): Onset on 12/28/2016 at 25 years.  Resolved on 09/20/2017 at 26 years.  Pregnant (09/22/2017): Onset on 01/31/2015 at 23 years.  Resolved on 10/17/2015 at 24 years.   Procedure history:    Extraction of wisdom tooth (10/19/2015).   Social History        Social & Psychosocial Habits    Alcohol  05/01/2021  Use: Denies    Substance Use  05/01/2021  Use: Denies    Tobacco  05/01/2021  Use: Never (less than 100 in l  .     Symptoms of Sleep Apnea Score: PAT Documentation: 0                        05/01/2021 17:02 EDT     PONV Risk Score: PAT Documentation: 3                        05/01/2021 17:09 EDT        Physical Examination   Vital Signs    05/01/2021 17:12 EDT Systolic Blood Pressure 105 mmHg    Diastolic Blood Pressure 62 mmHg    Temperature Oral 37.1 degC    Heart Rate Monitored 98 bpm    Respiratory Rate 16 br/min    Mean Arterial Pressure, Cuff 78 mmHg   05/01/2021 15:42 EDT Systolic Blood Pressure 99 mmHg    Diastolic Blood Pressure 57 mmHg  LOW    Heart Rate Monitored 118 bpm  HI    Mean Arterial Pressure, Cuff 70 mmHg   05/01/2021 15:00 EDT Systolic Blood Pressure 109 mmHg    Diastolic Blood Pressure 67 mmHg    Heart Rate Monitored 125 bpm  HI    Respiratory Rate 16 br/min    Mean Arterial Pressure, Cuff 82 mmHg         Vital Signs (last 24 hrs)_____  Last Charted___________  Temp Oral     37.1 degC  (JUL 10 17:12)  Resp Rate         16 br/min  (JUL 10 17:12)  SBP      105 mmHg  (JUL 10 17:12)  DBP      62 mmHg  (JUL 10 17:12)  Weight      62.6 kg  (JUL 10 17:01)  Height      157.5 cm  (JUL 10 17:01)  BMI      25.24  (JUL 10 17:01)     Measurements from flowsheet : Measurements   05/01/2021 17:01 EDT Height/Length Measured 157.5 cm    Weight Measured 62.6 kg    Weight Dosing 62.6 kg  Body Mass Index Measured 25.24 kg/m2   05/01/2021 14:59 EDT Body Mass Index est meas 25.24 kg/m2    Body Mass Index Measured 25.24 kg/m2   05/01/2021 14:51 EDT Height/Length Measured 157.5 cm    Weight Dosing 62.6 kg      Pain assessment:  Pain Assessment   05/01/2021 15:00 EDT Pasero Opioid Induced Sedation Scale 1=Awake and alert   05/01/2021 14:51 EDT Numeric Rating Pain Scale 6    Primary Pain Location Abdomen    Primary Pain Time Pattern Intermittent    Primary Pain Quality Cramping      .    General:       Appearance: Within normal limits.    Airway:          Thyromental Distance: Normal.         Mouth: Teeth ( Within normal limits ).         Throat: Within normal limits.    Neck:  Full range of motion.    Respiratory:  Lungs are clear to auscultation, Breath sounds are equal.    Cardiovascular:  Normal rate, Regular rhythm.       Review / Management   Results  review:     Labs (Last four charted values)  WBC                  H 14.8 (JUL 10)   Hgb                  L 10.0 (JUL 10)   Hct                  L 29.4 (JUL 10)   Plt                  176 (JUL 10) , Lab results   05/01/2021 16:57 EDT WBC 14.8 x10e3/mcL  HI    RBC 3.51 x10e6/mcL  LOW    Hgb 10.0 g/dL  LOW    Hct 17.0 %  LOW    MCV 83.8 fL    MCH 28.5 pg    MCHC 34.0 g/dL    RDW 01.7 %    Platelet 176 x10e3/mcL    MPV 10.3 fL   .       Assessment and Plan   Anesthetic Preoperative Plan     Risks discussed: nausea, vomiting, headache, sore throat, dental injury, hypotension, allergic reaction, serious complications.     Signature Line     Electronically Signed on 05/01/2021 05:26 PM EDT   ________________________________________________   Golden Circle C-MD

## 2021-05-01 NOTE — ED Notes (Signed)
ED Patient Education Note     Patient Education Materials Follows:

## 2021-05-01 NOTE — Anesthesia Pre-Procedure Evaluation (Signed)
Preanesthesia Evaluation TP        Patient:   Patty Tran, Patty Tran [facility]            MRN: [id number]            FIN: [id number]               Age:   30 years     Sex:  Female     DOB:  06/07/1991   Associated Diagnoses:   None   Author:   Golden Circle C-MD      Preoperative Information   NPO:  NPO greater than 8 hours.    Anesthesia history     Patient's history: negative.     Family's history: negative.        Health Status   Allergies:    Allergic Reactions (Selected)  No Known Medication Allergies,    Allergies    (Active and Proposed Allergies Only)  No Known Medication Allergies   (Severity: Unknown severity, Onset: Unknown)     Current medications:    Home Medications (2) Active  ferrous sulfate 325 mg, Oral, TID  Prenatal 1   ,    Medications (33) Active  Scheduled: (10)  acetaminophen 325 mg Tab  650 mg 2 tabs, Oral, On Call  AMPICILLIN 1GM/100ML NS ADM  1 g 100 mL, IV Piggyback, q4hr-INT  AMPICILLIN 2 GM/100 ML NS ADM  2 g 100 mL, IV Piggyback, Once  atropine-diphenoxylate 0.025 mg-2.5 mg Tab  2 tabs, Oral, On Call  atropine-diphenoxylate 0.025 mg-2.5 mg Tab  1 tabs, Oral, On Call  azithromycin 500mg /283ml NS ADM  500 mg 250 mL, IV Piggyback, On Call  ceFAZolin duplex  2 g 50 mL, IV Piggyback, On Call  oxytocin 30 units + premix sodium chloride 0.9%..... 500 mL  500 mL, IV Bolus  promethazine 25 mg/mL Inj Soln 1 mL  50 mg 2 mL, IM, On Call  Sodium Chloride 0.9% intravenous solution 1,000 mL  1,000 mL, IV  Continuous: (4)  bupivacaine-fentaNYL 0.125%-2 mcg/mL-NaCl 0.9% 100 mL  100 mL, Epidural, 12 mL/hr  Dextrose 5% with 0.9% NaCl intravenous solution 1,000 mL  1,000 mL, IV, 125 mL/hr  oxytocin 30 units + premix sodium chloride 0.9%..... 500 mL  500 mL, IV  Sodium Chloride 0.9% intravenous solution 1,000 mL  1,000 mL, IV Bolus, 999 mL/hr  PRN: (19)  acetaminophen 325 mg Tab  650 mg 2 tabs, Oral, q4hr  acetaminophen 500 mg Tab  1,000 mg 2 tabs, Oral, q6hr  acetaminophen 500 mg Tab  1,000 mg 2 tabs, Oral, On  Call  ammonia aromatic Solution  1 EA, Inhale, On Call  carboprost 250 mcg/mL Inj Soln 1 mL  250 mcg 1 mL, IM, On Call  ePHEDrine 50 mg/mL Inj Soln 1 mL  5 mg 0.1 mL, IV Push, q57min  lidocaine 1% Inj Soln 50 mL  50 mL, IM, On Call  methylergonovine 0.2 mg Tab  0.2 mg 1 tabs, Oral, q4hr  methylergonovine 0.2 mg/mL Inj Soln 1 mL  0.2 mg 1 mL, IM, On Call  mineral oil Oral Liquid 30 mL  30 mL, Topical, On Call  miSOPROStol  200 mcg Tab  800 mcg 4 tabs, PR, On Call  nalbuphine 10 mg/mL Inj Soln 1 mL  5 mg 0.5 mL, IV Push, q3hr  NIFEdipine 10 mg Cap  10 mg 1 caps, Oral, On Call  ondansetron 2 mg/mL Inj Soln 2 mL  4 mg 2 mL, IV Push,  q6hr  oxytocin 10 units/mL Inj Soln 1 mL  10 units 1 mL, IM, On Call  phenylephrine  100 mcg, IV Push, q57min  silver nitrate Stick  1 app, Topical, On Call  Sodium Chloride 0.9% intravenous solution Bolus  1,000 mL, IV Bolus, On Call  tranexamic acid 1000mg / 100 mL NS ADM + Premix ADM sodium chloride inj... Marland KitchenMarland Kitchen 100 mL  1,000 mg 100 mL, IV Piggyback, On Call     Problem list:    Active Problems (2)  Group B streptococcus   Pregnant   ,    Problems   (Active Problems Only)    Pregnant   (SNOMED CT: Marland Kitchen, Onset: 11/09/20)  Group B streptococcus   (SNOMED CT: 11/11/20, Onset: --)        Histories   Past Medical History:    Resolved  Pregnant ([id number]): Onset on 12/28/2016 at 25 years.  Resolved on 09/20/2017 at 26 years.  Pregnant (09/22/2017): Onset on 01/31/2015 at 23 years.  Resolved on 10/17/2015 at 24 years.   Procedure history:    Extraction of wisdom tooth (10/19/2015).   Social History        Social & Psychosocial Habits    Alcohol  05/01/2021  Use: Denies    Substance Use  05/01/2021  Use: Denies    Tobacco  05/01/2021  Use: Never (less than 100 in l  .     Symptoms of Sleep Apnea Score: PAT Documentation: 0                        05/01/2021 17:02 EDT     PONV Risk Score: PAT Documentation: 3                        05/01/2021 17:09 EDT        Physical Examination   Vital Signs    05/01/2021 17:12 EDT Systolic Blood Pressure 105 mmHg    Diastolic Blood Pressure 62 mmHg    Temperature Oral 37.1 degC    Heart Rate Monitored 98 bpm    Respiratory Rate 16 br/min    Mean Arterial Pressure, Cuff 78 mmHg   05/01/2021 15:42 EDT Systolic Blood Pressure 99 mmHg    Diastolic Blood Pressure 57 mmHg  LOW    Heart Rate Monitored 118 bpm  HI    Mean Arterial Pressure, Cuff 70 mmHg   05/01/2021 15:00 EDT Systolic Blood Pressure 109 mmHg    Diastolic Blood Pressure 67 mmHg    Heart Rate Monitored 125 bpm  HI    Respiratory Rate 16 br/min    Mean Arterial Pressure, Cuff 82 mmHg         Vital Signs (last 24 hrs)_____  Last Charted___________  Temp Oral     37.1 degC  (JUL 10 17:12)  Resp Rate         16 br/min  (JUL 10 17:12)  SBP      105 mmHg  (JUL 10 17:12)  DBP      62 mmHg  (JUL 10 17:12)  Weight      62.6 kg  (JUL 10 17:01)  Height      157.5 cm  (JUL 10 17:01)  BMI      25.24  (JUL 10 17:01)     Measurements from flowsheet : Measurements   05/01/2021 17:01 EDT Height/Length Measured 157.5 cm    Weight Measured 62.6 kg    Weight Dosing 62.6 kg  Body Mass Index Measured 25.24 kg/m2   05/01/2021 14:59 EDT Body Mass Index est meas 25.24 kg/m2    Body Mass Index Measured 25.24 kg/m2   05/01/2021 14:51 EDT Height/Length Measured 157.5 cm    Weight Dosing 62.6 kg      Pain assessment:  Pain Assessment   05/01/2021 15:00 EDT Pasero Opioid Induced Sedation Scale 1=Awake and alert   05/01/2021 14:51 EDT Numeric Rating Pain Scale 6    Primary Pain Location Abdomen    Primary Pain Time Pattern Intermittent    Primary Pain Quality Cramping      .    General:       Appearance: Within normal limits.    Airway:          Mallampati classification: II (soft palate, fauces, uvula visible).         Thyromental Distance: Normal.         Mouth: Teeth ( Within normal limits ).         Throat: Within normal limits.    Neck:  Full range of motion.    Respiratory:  Lungs are clear to auscultation, Breath sounds are equal.     Cardiovascular:  Normal rate, Regular rhythm.       Review / Management   Results review:     Labs (Last four charted values)  WBC                  H 14.8 (JUL 10)   Hgb                  L 10.0 (JUL 10)   Hct                  L 29.4 (JUL 10)   Plt                  176 (JUL 10) , Lab results   05/01/2021 16:57 EDT WBC 14.8 x10e3/mcL  HI    RBC 3.51 x10e6/mcL  LOW    Hgb 10.0 g/dL  LOW    Hct 56.2 %  LOW    MCV 83.8 fL    MCH 28.5 pg    MCHC 34.0 g/dL    RDW 56.3 %    Platelet 176 x10e3/mcL    MPV 10.3 fL   .       Assessment and Plan   American Society of Anesthesiologists#(ASA) physical status classification:  Class II.    Anesthetic Preoperative Plan     Anesthetic technique: Neuraxial.     Regional: Epidural.     Risks discussed: nausea, vomiting, headache, hypotension, allergic reaction, serious complications.     Signature Line     Electronically Signed on 05/01/2021 05:26 PM EDT   ________________________________________________   Golden Circle C-MD

## 2021-05-01 NOTE — ED Notes (Signed)
 ED Patient Summary                 [facility]  [address] [place], [place] [address]  [phone]  Patient Discharge Instructions  Name: Patty Tran, Patty Tran [facility]  Current Date: 05/01/2021 16:50:13  DOB: 1991/01/14 MRN: [id number] FIN: [phone]  Patient Address: [address] [place] [address]  Patient Phone: [phone]  Primary Care Provider:  Name: PCP,  NONE  Phone:     Discharge Diagnosis:   Discharged To: ANTICIPATED%>            Mode of Discharge Transportation: TRANSPORTATION%>  Discharge Orders    Comment:   Medications  During the course of your visit, your medication list was updated with the most current information. The details of those changes are reflected below:          Medications that have not changed  Other Medications  ferrous sulfate 325 Milligram Oral (given by mouth) 3 times a day., DO NOT CRUSH  Last Dose:____________________  multivitamin, prenatal (Prenatal 1)   Last Dose:____________________      [facility] would like to thank you for allowing us  to assist you with your healthcare needs. The following includes patient education materials and information regarding your injury/illness.  Patty Tran has been given the following list of follow-up instructions, prescriptions, and patient education materials:  Follow-up Instructions:                Type Location Start [place]   -MAINE Cervidil Induction - Insertion BH L&D Jul/24/2022 10:00 PM Jul/24/2022 10:15 PM Confirmed   -OB Cervidil Induction - Induction BH L&D Jul/25/2022 5:00 AM Jul/25/2022 5:30 AM Confirmed          It is important to always keep an active list of medications available so that you can share with other providers and manage your medications appropriately. As an additional courtesy, we are also providing you with your final active medications list that you can keep with you.          ferrous sulfate 325 Milligram Oral (given by mouth) 3 times a day., DO  NOT CRUSH  multivitamin, prenatal (Prenatal 1)      Take only the medications listed above. Contact your doctor prior to taking any medications not on this list.  ---------------------------------------------------------------------------------------------------------------------------  URGENT MATERNAL WARNING SIGNS           If you have any of these symptoms during or after pregnancy, contact your health care provider and get help right away. If you can't reach your provider, go to the emergency room.       Headache that won't go away or gets worse over time Trouble breathing Vaginal bleeding or fluid leaking during pregnancy Dizziness or fainting Chest Pain or fast beating heart   Vaginal bleeding or fluid leaking after pregnancy Thoughts about hurting yourself or your baby Severe belly pain that doesn't go away Swelling, redness or pain of your leg Changes in your vision   Severe nausea and throwing up(not like morning sickness) Extreme swelling of your hands and face Fever Baby's movements stopping or slowing during pregnancy Overwhelming tiredness       Additional tips for using this list:   * This list is not meant to cover everthing you might be experiencing. If you feel like something just isn't right, it's always best to tell your provider and get the help you need.   * Always remember  to say that you're pregnant or have been pregnant within the last year when getting help.   * Symptoms are listed from head to toe.  Go to [email or web address]  Or scan this code with your camera to download the Urgent Maternal Warning Signs.     ---------------------------------------------------------------------------------------------------------------------------  Discharge instructions, if any, will display below  Instructions for Diet: FOR DIET%>  Instructions for Supplements: SUPPLEMENT INSTRUCTIONS%>  Instructions for Activity: INSTRUCTIONS FOR  ACTIVITY%>  Instructions for Wound Care: INSTRUCTIONS FOR WOUND CARE%>  Medication leaflets, if any, will display below    Patient education materials, if any, will display below       IS IT A STROKE? Act FAST and Check for these signs:  FACE          Does the face look uneven?  ARM          Does one arm drift down?  SPEECH     Does their speech sound strange?  TIME          Call 9-1-1 at any sign of stroke  --------------------------------------------------------------------------------------------------------------------------  Heart Attack Signs  Chest discomfort: Most heart attacks involve discomfort in the center of the chest and lasts more than a few minutes, or goes away and comes back. It can feel like uncomfortable pressure, squeezing, fullness or pain.  Discomfort in upper body: Symptoms can include pain or discomfort in one or both arms, back, neck, jaw or stomach.  Shortness of breath: With or without discomfort.  Other signs: Breaking out in a cold sweat, nausea, or lightheaded.  Remember, MINUTES DO MATTER. If you experience any of these heart attack warning signs, call 9-1-1 to get immediate medical attention!  ---------------------------------------------------------------------------------------------------------------------------  [facility] allows patients to review your COVID and other test results as well as discharge documents from any [facility], Emergency Department, surgical center or outpatient lab. Test results are typically available 36 hours after the test is completed.     [facility] encourages you to self-enroll in the [facility] Patient Portal.     To begin your self-enrollment process, please visit [email or web address] Under [facility], click on "Sign up now".     NOTE: You must be 16 years and older to use [facility] Self-Enroll online. If you are a parent, caregiver, or guardian; you need an invite to  access your child's or dependent's health records. To obtain an invite, contact the Medical Records department at [phone] Monday through Friday, 8-4:30, select option 3 . If we receive your call afterhours, we will return your call the next business day.     If you have issues trying to create or access your account, contact [employer] support at [phone] available 7 days a week 24 hours a day.     Scan this code with your phone camera to download the My [facility] Baby app.

## 2021-05-01 NOTE — ED Notes (Signed)
ED Triage Note       OBED Triage Entered On:  05/01/2021 14:58 EDT    Performed On:  05/01/2021 14:51 EDT by Randa EvensEDWARDS, RN, NICOLE M               OBED Triage   Barriers to Learning :   None evident   Primary Language :   English   Chief Complaint :   I'm having contractions   Mode of Arrival :   Wheelchair   Infectious Disease Documentation :   Document assessment   OBED Reason for Visit :   Abdominal pain-pregnant, Contractions   Patient received chemo or biotherapy last 48 hrs? :   No   Dosing Weight Obtained By :   Patient stated   Weight Dosing :   62.6 kg(Converted to: 138 lb 0 oz)    Height :   157.5 cm(Converted to: 5 ft 2 in)    Body Mass Index Dosing :   25 kg/m2   Numeric Rating Pain Scale :   6   ED Pain Details :   Pain Details   EDWARDS, RN, NICOLE M - 05/01/2021 14:51 EDT   DCP GENERIC CODE   Tracking Acuity :   4   Tracking Group :   ED OB [employer]   EDWARDS, RN, Jodelle GrossICOLE M - 05/01/2021 14:51 EDT   ED Allergies Section :   Document assessment   ED Home Meds Section :   Document assessment   Subjective Document Assessment :   Document assessment   OB History Document Assessment :   Document assessment   OB Ante Risk Factors Doc Assess :   Document assessment   OB Prenatal HX Doc Assess :   Document assessment   OB PMH PSH Document Assessment :   Document assessment   ED General Section :   Document assessment   ED History Section :   Document assessment   Immunization Document Assessment :   Document assessment   ED Advance Directives Section :   Document assessment   ED Bloodless Medicine :   Document assessment   ED Valuables / Belongings Sections :   Document assessment   ED Assessment Section :   Document assessment   EDWARDS, RN, NICOLE M - 05/01/2021 14:51 EDT   ID Risk Screen Symptoms   Recent Travel History :   No recent travel   TB Symptom Screen :   No symptoms   Last 90 days COVID-19 ID :   No   Close Contact with COVID-19 ID :   No   Last 14 days COVID-19 ID :   No   C. diff  Symptom/History ID :   Neither of the above   Patient Pregnant :   None of the above   EDWARDS, RN, NICOLE M - 05/01/2021 14:51 EDT   Pain Assessment   Time Pattern :   Intermittent   Pain Location :   Abdomen   Quality :   Cramping   EDWARDS, RN, NICOLE M - 05/01/2021 14:51 EDT   Image 4 -  Images currently included in the form version of this document have not been included in the text rendition version of the form.   Allergies   (As Of: 05/01/2021 14:58:59 EDT)   Allergies (Active)   No Known Medication Allergies  Estimated Onset Date:   Unspecified ; Created By:   Randa EvensEDWARDS, RN, NICOLE M; Reaction Status:   Active ;  Category:   Drug ; Substance:   No Known Medication Allergies ; Type:   Allergy ; Updated By:   Everlean Alstrom; Reviewed Date:   05/01/2021 14:52 EDT        ED Home Med List   Medication List   (As Of: 05/01/2021 14:58:59 EDT)   Home Meds    ferrous sulfate  :   ferrous sulfate ; Status:   Documented ; Ordered As Mnemonic:   ferrous sulfate ; Simple Display Line:   325 mg, Oral, TID, 0 Refill(s) ; Catalog Code:   ferrous sulfate ; Order Dt/Tm:   05/01/2021 14:54:37 EDT ; Comment:   DO NOT CRUSH          multivitamin, prenatal  :   multivitamin, prenatal ; Status:   Documented ; Ordered As Mnemonic:   Prenatal 1 ; Simple Display Line:   0 Refill(s) ; Catalog Code:   multivitamin, prenatal ; Order Dt/Tm:   05/01/2021 14:53:58 EDT            Subjective Data   Chief Complaint/Patient Verbalization of Reason for Admission :   I'm having contractions   Reason For Visit OB :   Abdominal pain   Indications for Induction :   Present   Contractions :   Yes   Urge to Push :   No   Bleeding :   No   Leaking Fluid :   No   Intercourse last 24 hours :   No   EDWARDS, RN, NICOLE M - 05/01/2021 14:51 EDT   Obstetrical History   DCP GENERIC CODE   Breastfeeding, 24 Hour Rooming In :   [employer] understanding   Breastfeeding, Benefit Beyond 6 Mos :   Verbalizes understanding   Breastfeeding, Early Initiation :    Verbalizes understanding   Breastfeeding Exclusively   Benefits :   Verbalizes understanding   Breastfeeding, Skin to Skin :   Verbalizes understanding   EDWARDS, RN, NICOLE M - 05/01/2021 14:51 EDT   Pregnancy History   (As Of: 05/01/2021 14:58:59 EDT)   Patty Tran - 3, Para Fullterm - 2, Para Preterm - , Abortions - , Para Living - 2      Delivery/Outcome Date: 10/17/2015   Gestation Age At Birth:   37 Weeks 0 Days ;   Delivery Method:   Vaginal ; Neonate Outcome:   Live Birth          Delivery/Outcome Date: 09/20/2017   Gestation Age At Birth:   66 Weeks 0 Days ; Full Gestation ;  Delivery Method:   Vaginal ; Neonate Outcome:   Live Birth            Antepartum Risk Factors   Antepartum Risk Factors :   Group B Streptococcus   Reg PC Prior Uterine Surgery vE :   None   EDWARDS, RN, NICOLE M - 05/01/2021 14:51 EDT   Prenatal Info   Primary OB Provider :   Dr. Rivka Safer   Prenatal Records Available :   Yes   Prenatal Care :   4 visits or more   Blood Type, External :   O positive   Antibody Screen, External :   Negative   Rubella, External :   Immune   RPR, External :   Negative   Hepatitis B, External :   Negative   Transcribed, Hepatitis C :   Negative   HIV Antibodies, External :   Negative   Gonorrhea, External :  Negative   Chlamydia, External :   Negative   Herpes Simplex Virus, External :   Unknown   Tdap Vaccine, External :   Yes   Toxicology Screen, External :   No   Group B Strep, External :   Positive   Rhogam, External :   N/A   Antepartum Steroids, External :   None   EDWARDS, RN, NICOLE M - 05/01/2021 14:51 EDT   Past Medical History   Past Medical History   (As Of: 05/01/2021 14:58:59 EDT)   Pregnant  Name of Problem:   Pregnant ; Onset Date:   01/31/2015 ; Recorder:   EDWARDS, RN, NICOLE M; Confirmation:   Confirmed ; Classification:   Medical ; Code:   195093267 ; Last Updated:   05/01/2021 14:50 EDT ; Life Cycle Date:   Unknown 10/17/2015 ; Life Cycle Status:   Resolved ; Vocabulary:   SNOMED CT ;  Resolved Date:   Unknown 10/17/2015 ; Resolved Age:   89 years          Pregnant  Name of Problem:   Pregnant ; Onset Date:   12/28/2016 ; Recorder:   EDWARDS, RN, NICOLE M; Confirmation:   Confirmed ; Classification:   Medical ; Code:   124580998 ; Last Updated:   05/01/2021 14:51 EDT ; Life Cycle Date:   Unknown 09/20/2017 ; Life Cycle Status:   Resolved ; Vocabulary:   SNOMED CT ; Resolved Date:   Unknown 09/20/2017 ; Resolved Age:   26 years            (As Of: 05/01/2021 14:58:59 EDT)   Problems(Active)    Pregnant (SNOMED CT  :338250539 )  Name of Problem:   Pregnant ; Onset Date:   11/09/2020 ; Recorder:   EDWARDS, RN, NICOLE M; Confirmation:   Confirmed ; Classification:   Medical ; Code:   [id number] ; Last Updated:   05/01/2021 14:50 EDT ; Life Cycle Status:   Active ; Responsible Provider:   EDWARDS, RN, Jodelle Gross; Vocabulary:   SNOMED CT          Harm Screen   Feels Safe Where Live :   Yes   EDWARDS, RN, NICOLE M - 05/01/2021 14:51 EDT   Social History   Social History   (As Of: 05/01/2021 14:59:00 EDT)   Tobacco:        Tobacco use: Never (less than 100 in lifetime).   (Last Updated: 05/01/2021 14:58:10 EDT by Randa Evens, RN, NICOLE M)          Alcohol:        Denies   (Last Updated: 05/01/2021 14:58:15 EDT by Randa Evens, RN, NICOLE M)          Substance Use:        Denies   (Last Updated: 05/01/2021 14:58:19 EDT by Randa Evens, RN, NICOLE M)            Advance Directive   Advance Directive :   No   EDWARDS, RN, Jodelle Gross - 05/01/2021 14:51 EDT   Bloodless Medicine   Is Blood Transfusion Acceptable to Patient :   Yes   EDWARDS, RN, NICOLE M - 05/01/2021 14:51 EDT   Valuables and Belongings   Does Patient Have Valuables and Belongings :   Yes   EDWARDS, RN, NICOLE M - 05/01/2021 14:51 EDT   Valuables and Belongings   At Bedside :   Clothes, Jewelry, Cell phone   EDWARDS, RN, NICOLE M - 05/01/2021 14:51 EDT  Patient Search Completed :   NA   EDWARDS, RN, NICOLE M - 05/01/2021 14:51 EDT   Assessment   Level of Consciousness :   Alert    Orientation :   Oriented x 4   Affect/Behavior :   Appropriate, Calm   Skin Color :   Normal for ethnicity   Skin Description :   Dry   Skin Temperature :   Warm   EDWARDS, RN, NICOLE M - 05/01/2021 14:51 EDT

## 2021-05-01 NOTE — Discharge Summary (Signed)
 ED Clinical Summary              [facility]  Post-Acute Care Transfer Instructions  PERSON INFORMATION  Name: Patty Tran, Patty Tran [facility]  MRN: [id number]   FIN#: [id number]  PHYSICIANS  Admitting Physician: PIETRO HARTMANN  Attending Physician: JAMELLE MOATS S-MD  PCP: PCP,  NONE  Discharge Diagnosis:    Comment:     PATIENT EDUCATION INFORMATION  Instructions:    Medication Leaflets:    Follow-up:                Type Location Start [place]   -MAINE Cervidil Induction - Insertion BH L&D Jul/24/2022 10:00 PM Jul/24/2022 10:15 PM Confirmed   -OB Cervidil Induction - Induction BH L&D Jul/25/2022 5:00 AM Jul/25/2022 5:30 AM Confirmed          MEDICATION LIST  Medication Reconciliation at Discharge:          Medications that have not changed  Other Medications  ferrous sulfate 325 Milligram Oral (given by mouth) 3 times a day., DO NOT CRUSH  Last Dose:____________________  multivitamin, prenatal (Prenatal 1)   Last Dose:____________________         Patient's Final Home Medication List Upon Discharge:          ferrous sulfate 325 Milligram Oral (given by mouth) 3 times a day., DO NOT CRUSH  multivitamin, prenatal (Prenatal 1)         Comment:     ORDERS          Order Name Order Details

## 2021-05-02 NOTE — Progress Notes (Signed)
Inpt Note        Patient:   Patty Tran, Patty Tran            MRN: [id number]            FIN: [id number]               Age:   30 years     Sex:  Female     DOB:  1991/06/04   Associated Diagnoses:   None   Author:   Golden Circle C-MD      Pt seen, happy with their anesthesia, no questions or concerns.  Available as needed.   Signature Line     Electronically Signed on 05/02/2021 06:58 AM EDT   ________________________________________________   Golden Circle C-MD

## 2021-05-03 NOTE — Progress Notes (Signed)
 Pregnancy Summary Document     Patty Tran, Patty Tran  DOB:  [birth year] Age:  30 Years MRN:  [id number]  --------------------------------------------------------------------------------------------------------------------------------------------   Patty Tran  DOB: 11-08-1990  Pregnancy Summary    (Date of Report: 05/03/21)    G 3 P 2 (2,0,0,2)  Gestation: Patty Tran (from pt. history): --           EDD: 05/21/2021 EDD/EGA Method: Last Menstrual Period EGA: Delivered                Gestation info at delivery:    A : 37 weeks 2 days  --------------------------------------------------------------------------------------------------------------------------------------------   Patty Tran  DOB: May 04, 1991  Antepartum Note     No antepartum notes have been documented    --------------------------------------------------------------------------------------------------------------------------------------------   Patty Tran  DOB: [birth year]  Problems   (Active Problems Only)  Pregnant   (SNOMED CT: 808926986, Onset: 11/09/20)  Group B streptococcus   (SNOMED CT: 506381983, Onset: --)    --------------------------------------------------------------------------------------------------------------------------------------------   Patty Tran  DOB: 04/04/1991  Risk Factors and Genetic Screening     All Results Documented Since 11/09/2020  Risk Factors (Current Pregnancy)  Unique Risk Factors:  Group B Streptococcus    Ethnic Screening        No ethnicities have been recorded.    Genetic Disorders Screening        No genetic disorders have been recorded.    --------------------------------------------------------------------------------------------------------------------------------------------   Patty Tran  DOB: 1991-08-26  Gestational Age (EGA) and EDD     * Note: EGA calculated as of 05/03/2021    EDD: 05/21/2021 EGA*: 37 weeks 2 days            Type: Authoritative Method Date:  11/09/2020          Method: Last Menstrual Period (11/09/2020)        Confirmation: Confirmed        Description: --        Comments: --        Entered by: CELESTIA, RN, NICOLE M on 05/01/2021     Other EDD Calculations for this Pregnancy:        No additional EDD calculations have been recorded for this pregnancy    --------------------------------------------------------------------------------------------------------------------------------------------   Patty Tran  DOB: 12/25/1990  Measurements  Pre-Pregnancy Weight: --      Recent Weight Measured: 62.6 kg     05/01/21 (37 weeks)  Height/Length Measured: 157.5 cm    05/01/21 (37 weeks)  Body Mass Index Measured: 25.24 kg/m2    05/01/21 (37 weeks)    --------------------------------------------------------------------------------------------------------------------------------------------   Patty Tran  DOB: 05-27-1991  Prenatal Exam and Notes    Date EGA FunHt PTL S/S Cervix BP Weight Urine Baby     cm  Dil Eff(%) Sta mmHg lbs  kg Gluc Prot FHR Fetal Activity Fet Pres   05/02/21 [redacted]w[redacted]d --       --      10     100% +1  126/60 -- --         --         A       = 140bpm  -- --   05/01/21 [redacted]w[redacted]d --       --      6      50%  -2  106/57 *138... --         --         A       =  145bpm  -- --       * Weight  05/01/2021 138.033(lbs)62.6(kg)    --------------------------------------------------------------------------------------------------------------------------------------------   Patty Tran  DOB: December 11, 1990  Physical Exams  Physical Exam   Mental Status    Level of Consciousness - APVU:  Alert (05/01/21)    Orientation Assessment:  Oriented x 4 (05/03/21)    Affect/Behavior:  Appropriate (05/03/21)    Appearance BH:  Appropriate (05/03/21)    Hallucinations Present:  None (05/03/21)   Cardiovascular    Cardiovascular Symptoms:  None (05/03/21)    Nail Bed Color:  Pink (05/03/21)    Capillary Refill:  Less than 2 seconds (05/03/21)    Clubbing  Present:  No (05/03/21)    Jugular Venous Distention:  Unable to visualize (05/03/21)    Heart Sounds ICU:  S1S2 (05/03/21)   Respiratory    Respiratory Symptoms:  None (05/03/21)    Shortness of Breath Indicator:  No shortness of breath (05/03/21)    Respirations:  Unlabored (05/03/21)    Respiratory Pattern:  Regular (05/03/21)    Chest Motion:  Symmetrical (05/03/21)    Oxygen Therapy:  Room air (05/03/21)    SpO2:  98 % (05/03/21)    Cough:  None (05/03/21)    All Lobes Breath Sounds:  Clear (05/03/21)   Gastrointestinal    GI Symptoms:  None (05/03/21)     Abdomen Tender:  All quadrants (05/02/21)    Appetite:  Good (05/03/21)    Abdomen Description:  Nondistended (05/03/21)    Abdomen Palpation:  Soft (05/03/21)    Bowel Sounds All Quadrants:  Present (05/02/21)    Passing Flatus:  No (05/02/21)   Extremities    Clonus:  Not present (05/03/21)    Edema     Edema:  None (05/03/21)   Integumentary    Skin Color General:  Usual for ethnicity (05/03/21)    Skin Color:  Normal for ethnicity (05/01/21)    Skin Description:  Dry (05/01/21)    Skin Temperature:  Warm (05/03/21)    Skin Turgor General:  Elastic (05/03/21)    Skin Moisture General:  Dry (05/03/21)    Skin Integrity General:  Intact (05/03/21)    Mucous Membrane Color:  Pink (05/03/21)    Mucous Membrane Description:  Moist (05/03/21)    --------------------------------------------------------------------------------------------------------------------------------------------   Patty Tran  DOB: Aug 08, 1991  Blood Types and Anti-D Immune Globulin  Blood Type and Screen  Mother ABO/Rh:  --  Mother Antibody Screen:  --  Father of Baby ABO/Rh:  --  Father of Baby Antibody Screen:  --    Anti-D Immune Globulin  Rho(D) Initial Status:  --  Rho(D) 28 Week Status:  --  Rho(D) Dates Given:  --    --------------------------------------------------------------------------------------------------------------------------------------------   Patty GRATE  Tran  DOB: 03-09-1991  Prenatal Tests and Lab Results  Results ending with 'TR' have been keyed in by the practice or interpreted manually.  Result Date: Estimated weeks post pregnancy onset will display next to actual result date.      Test Result Date Ref Range    Osbr Risk 1 In(R)  (N) 7366   12/08/20  (16 weeks)      Test Results AFP (R)  (N) *Screen Negative*   12/08/20  (16 weeks)      UE3 Mom(R)  (N) 1.17   12/08/20  (16 weeks)      AFP Results(R)  (N) Report   12/08/20  (16 weeks)      T18 Risk(R)  (  N) Not increased   12/08/20  (16 weeks)      HCG Value(R)  (N) 43668 mIU/mL  12/08/20  (16 weeks)      Insulin Dep Diab(R)  (N) No   12/08/20  (16 weeks)      Gestational Date(R)  (N) 16.6 weeks  12/08/20  (16 weeks)      Comments(R)-AFP  (N) Comment   12/08/20  (16 weeks)      DSR(2Nd Tri) 1In(R)  (N) 10000   12/08/20  (16 weeks)      Mult Gestation(R)  (N) No   12/08/20  (16 weeks)      HCG Mom(R)  (N) 1.01   12/08/20  (16 weeks)      Dia Mom(R)  (N) 0.62   12/08/20  (16 weeks)      Interpretation(R)-AFP  (N) Comment   12/08/20  (16 weeks)      AFP Race(R)  (N) Caucasian   12/08/20  (16 weeks)      Weight(R)-AFP  (N) 117 lb  12/08/20  (16 weeks)      Dia Value(R)  (N) 114.59 pg/mL  12/08/20  (16 weeks)      AFP Value(R)  (N) 50.3 ng/mL  12/08/20  (16 weeks)      Ga Based On(R)  (N) Ultrasound   12/08/20  (16 weeks)      Mat Age At [facility])  (N) 31.7   12/08/20  (16 weeks)      UE3 Value(R)  (N) 1.38 ng/mL  12/08/20  (16 weeks)      AFP Mom(R)  (N) 1.16   12/08/20  (16 weeks)      T18 (By Age)(R)  (N) 1:2153   12/08/20  (16 weeks)      DSR (By Age) 1In(R)  (N) 553   12/08/20  (16 weeks)         Test Result Date Ref Range    WBC  7.6 x10e3/mcL  03/03/21  (28 weeks)   (3.8 - 10.6)    RBC  3.69 x10e6/mcL  03/03/21  (28 weeks)   (3.60 - 5.20)    MCV  87.3 fL  03/03/21  (28 weeks)   (81.0 - 99.0)    MCHC  33.9 g/dL  94/87/77  (28 weeks)   (32.0 - 36.0)    HCT  (L) 32.2 %  03/03/21  (28 weeks)  (34.0 - 47.0)    MCH  29.5  pg  03/03/21  (28 weeks)   (27.0 - 34.5)    Hgb  (L) 10.9 g/dL  94/87/77  (28 weeks)  (11.5 - 15.7)    MPV  10.3 fL  03/03/21  (28 weeks)   (7.2 - 13.2)    Platelet  200 x10e3/mcL  03/03/21  (28 weeks)   (140 - 440)    RDW  13.5 %  03/03/21  (28 weeks)   (11.0 - 16.0)    NRBC Absolute Auto  0.000 x10e3/mcL  03/03/21  (28 weeks)   (0.000 - 0.012)    NRBC Percent Auto  0.0 %  03/03/21  (28 weeks)   (0.0 - 0.2)    Glu 1 Hr PG OB  (H) 173 mg/dL  94/87/77  (28 weeks)  (50 - 135)       Test Result Date Ref Range    Glucose 3 Hour  101 mg/dL  94/82/77  (29 weeks)   (70 - 140)    Glucose Fasting  83 mg/dL  03/08/21  (29 weeks)   (70 - 99)    Glucose 2 Hour  152 mg/dL  94/82/77  (29 weeks)   (70 - 155)    Glucose 1 Hour  (H) 195 mg/dL  94/82/77  (29 weeks)  (70 - 180)    MCH  27.7 pg  04/18/21  (35 weeks)   (27.0 - 34.5)    HCT  (L) 30.3 %  04/18/21  (35 weeks)  (34.0 - 47.0)    MCHC  32.7 g/dL  93/72/77  (35 weeks)   (32.0 - 36.0)    MCV  84.6 fL  04/18/21  (35 weeks)   (81.0 - 99.0)    RBC  (L) 3.58 x10e6/mcL  04/18/21  (35 weeks)  (3.60 - 5.20)    WBC  7.9 x10e3/mcL  04/18/21  (35 weeks)   (3.8 - 10.6)    MPV  10.8 fL  04/18/21  (35 weeks)   (7.2 - 13.2)    NRBC Percent Auto  0.0 %  04/18/21  (35 weeks)   (0.0 - 0.2)    NRBC Absolute Auto  0.000 x10e3/mcL  04/18/21  (35 weeks)   (0.000 - 0.012)    RDW  13.4 %  04/18/21  (35 weeks)   (11.0 - 16.0)    Platelet  222 x10e3/mcL  04/18/21  (35 weeks)   (140 - 440)    Hgb  (L) 9.9 g/dL  93/72/77  (35 weeks)  (11.5 - 15.7)    Strep Gp B NAA (R)  (A) Positive   04/18/21  (35 weeks)  Negative    WBC  (H) 14.8 x10e3/mcL  05/01/21  (37 weeks)  (3.8 - 10.6)    RDW  13.7 %  05/01/21  (37 weeks)   (11.0 - 16.0)    HCT  (L) 29.4 %  05/01/21  (37 weeks)  (34.0 - 47.0)    MCHC  34.0 g/dL  92/89/77  (37 weeks)   (32.0 - 36.0)    Hgb  (L) 10.0 g/dL  92/89/77  (37 weeks)  (11.5 - 15.7)    MCH  28.5 pg  05/01/21  (37 weeks)   (27.0 - 34.5)    RBC  (L) 3.51 x10e6/mcL  05/01/21  (37 weeks)   (3.60 - 5.20)    MPV  10.3 fL  05/01/21  (37 weeks)   (7.2 - 13.2)    Platelet  176 x10e3/mcL  05/01/21  (37 weeks)   (140 - 440)    MCV  83.8 fL  05/01/21  (37 weeks)   (81.0 - 99.0)    EABORh Interp  (U) O POS   05/01/21  (37 weeks)      ECABS Interp  Negative  05/01/21  (37 weeks)          --------------------------------------------------------------------------------------------------------------------------------------------   Patty Tran  DOB: September 24, 1991  Actions      No Actions have been documented.    --------------------------------------------------------------------------------------------------------------------------------------------   Patty Tran  DOB: Apr 23, 1991  Situational Awareness      No comments have been documented.    --------------------------------------------------------------------------------------------------------------------------------------------   Patty Tran  DOB: [birth year]  Allergies    (Active and Proposed Allergies Only)  No Known Medication Allergies   (Severity: Unknown severity, Onset: Unknown)    --------------------------------------------------------------------------------------------------------------------------------------------   Patty Tran  DOB: 01-13-1991  Medications  Prescriptions and Home Medications   Currently Active or Taking    ferrous sulfate (Historically recorded)     SIG:  325 mg, Oral, TID, 0 Refill(s)     Ordered: 05/01/21 Provider: --    ibuprofen 800 mg oral tablet     SIG: 800 mg, 1 tabs, Oral, q8hr, 30 tabs, 0 Refill(s)     Ordered: 05/03/21 Provider: JANEECE FELL E-MD    Prenatal 1 (Historically recorded)     SIG: 0 Refill(s)     Ordered: 05/01/21 Provider: --     Inactive or Suspended   (Prescriptions and documented medications since 08/09/20)    No medications have been recorded    Medication Administrations In-Office/Hospital   (All in-office/hospital administrated medications ordered since  08/09/20)   ampicillin FlipTop vial (ampicillin)    UBC, Once    Status: Completed Ordered: 05/01/21 Provider: CONTRIBUTOR_SYSTEM,  PYXIS   ampicillin FlipTop vial (ampicillin)    UBC, Once    Status: Completed Ordered: 05/01/21 Provider: CONTRIBUTOR_SYSTEM,  PYXIS   ampicillin FlipTop vial (ampicillin)    UBC, Once    Status: Completed Ordered: 05/02/21 Provider: CONTRIBUTOR_SYSTEM,  PYXIS   ampicillin IVPB (AMPICILLIN 2 GM/100 ML NS ADM)    2 g, 100 mL, 200 mL/hr, IV Piggyback, Once    Status: Completed Ordered: 05/01/21 Provider: PIETRO,  MELISSA-MD   dexmedeTOMIDine (dexmedetomidine)    UBC, Once    Status: Completed Ordered: 05/01/21 Provider: HUBBARD ROERS   oxytocin    UBC, Once    Status: Completed Ordered: 05/01/21 Provider: CONTRIBUTOR_SYSTEM,  PYXIS   Sodium Chloride 0.9% 1,000 mL (Sodium Chloride 0.9% intravenous solution 1,000 mL)    999 mL/hr, IV Bolus, Stop: 05/01/21 18:24:00 EDT    Status: Completed Ordered: 05/01/21 Provider: PERRI BIRMINGHAM C-MD   Sodium Chloride 0.9% intravenous solution minibag plus (Sodium Chloride 0.9%)    UBC, Once    Status: Completed Ordered: 05/01/21 Provider: CONTRIBUTOR_SYSTEM,  PYXIS   Sodium Chloride 0.9% intravenous solution minibag plus (Sodium Chloride 0.9%)    UBC, Once    Status: Completed Ordered: 05/01/21 Provider: CONTRIBUTOR_SYSTEM,  PYXIS   Sodium Chloride 0.9% intravenous solution minibag plus (Sodium Chloride 0.9%)    UBC, Once    Status: Completed Ordered: 05/02/21 Provider: HUBBARD,  PYXIS   ampicillin IVPB (AMPICILLIN 1GM/100ML NS ADM)    1 g, 100 mL, 200 mL/hr, IV Piggyback, q4hr-INT    Status: Discontinued Ordered: 05/01/21 Provider: PIETRO,  MELISSA-MD   bupivacaine-fentaNYL 0.125%-2 mcg/mL-NaCl 0.9% 100 mL PREMIX epidural 100 mL (bupivacaine-fentaNYL 0.125%-2 mcg/mL-NaCl 0.9% 100 mL)    12 mL/hr, Epidural, Stop: 05/06/21 17:24:00 EDT    Status: Discontinued Ordered: 05/01/21 Provider: POWELL,  TAYLOR C-MD   ePHEDrine  (ePHEDrine 50 mg/mL Inj Soln 1 mL)    5 mg, 0.1 mL, IV Push, q5min, PRN: other (see comment)    Status: Discontinued Ordered: 05/01/21 Provider: PERRI BIRMINGHAM C-MD   NIFEdipine (NIFEdipine 10 mg Cap)    10 mg, 1 caps, Oral, On Call, PRN: hypertension severe    Status: Discontinued Ordered: 05/01/21 Provider: CRENSHAW,  MELISSA-MD   oxytocin IV additive 30 units + Sodium Chloride 0.9% 500 mL (oxytocin 30 units + premix sodium chloride 0.9%..... 500 mL)    TITRATE, IV    Status: Discontinued Ordered: 05/01/21 Provider: PIETRO,  MELISSA-MD   phenylephrine (phenylephrine 10 mg/mL Inj Soln 1 mL)    100 mcg, 0.01 mL, IV Push, q5min, PRN: other (see comment)    Status: Discontinued Ordered: 05/01/21 Provider: PERRI BIRMINGHAM C-MD   acetaminophen (acetaminophen 325 mg Tab)    650 mg, 2 tabs, Oral, On Call  Status: Ordered Ordered: 05/01/21 Provider: CRENSHAW,  MELISSA-MD   acetaminophen (acetaminophen 500 mg Tab)    1,000 mg, 2 tabs, Oral, q6hr, PRN: mild pain (1-3)    Status: Ordered Ordered: 05/01/21 Provider: PIETRO HARTMANN   acetaminophen (acetaminophen 325 mg Tab)    650 mg, 2 tabs, Oral, q4hr, PRN: fever    Status: Ordered Ordered: 05/01/21 Provider: PIETRO HARTMANN   acetaminophen (acetaminophen 325 mg Tab)    650 mg, 2 tabs, Oral, q4hr, PRN: fever    Status: Ordered Ordered: 05/02/21 Provider: PIETRO HARTMANN   acetaminophen (acetaminophen 500 mg Tab)    1,000 mg, 2 tabs, Oral, q8hr-INT    Status: Ordered Ordered: 05/02/21 Provider: PIETRO HARTMANN   acetaminophen 500 mg oral tablet (acetaminophen 500 mg Tab)    1,000 mg, 2 tabs, Oral, On Call, PRN: other (see comment)    Status: Ordered Ordered: 05/01/21 Provider: PIETRO,  MELISSA-MD   ammonia aromatic solution (ammonia aromatic Solution)    1 EA, Inhale, On Call, PRN: other (see comment)    Status: Ordered Ordered: 05/01/21 Provider: PIETRO,  MELISSA-MD   ammonia aromatic solution (ammonia aromatic Solution)    1 EA, Inhale,  q15min, PRN: other (see comment)    Status: Ordered Ordered: 05/02/21 Provider: PIETRO HARTMANN   Ancef IVPB (ceFAZolin duplex)    2 g, 50 mL, 100 mL/hr, IV Piggyback, On Call    Status: Ordered Ordered: 05/01/21 Provider: PIETRO,  MELISSA-MD   azithromycin IVPB (azithromycin 500mg /232ml NS ADM)    500 mg, 250 mL, 250 mL/hr, IV Piggyback, On Call    Status: Ordered Ordered: 05/01/21 Provider: PIETRO HARTMANN   calcium carbonate (calcium carbonate 500 mg (200 mg elemental calcium) Chew Tab)    500 mg, 1 tabs, Chewed, TID, PRN: indigestion    Status: Ordered Ordered: 05/02/21 Provider: PIETRO HARTMANN   carboprost POST DELIVERY ONLY (carboprost 250 mcg/mL Inj Soln 1 mL)    250 mcg, 1 mL, IM, On Call, PRN: bleeding    Status: Ordered Ordered: 05/01/21 Provider: PIETRO HARTMANN   Colace (docusate sodium 100 mg Cap)    100 mg, 1 caps, Oral, BID, PRN: constipation    Status: Ordered Ordered: 05/02/21 Provider: PIETRO,  MELISSA-MD   D5NS 1,000 mL (Dextrose 5% with 0.9% NaCl intravenous solution 1,000 mL)    125 mL/hr, IV    Status: Ordered Ordered: 05/02/21 Provider: CRENSHAW,  MELISSA-MD   Dermoplast 20% topical spray (benzocaine 20% Topical Spray)    1 sprays, Topical, QID, PRN: perineal discomfort    Status: Ordered Ordered: 05/02/21 Provider: PIETRO,  MELISSA-MD   Dextrose 5% with 0.9% NaCl 1,000 mL (Dextrose 5% with 0.9% NaCl intravenous solution 1,000 mL)    125 mL/hr, IV    Status: Ordered Ordered: 05/01/21 Provider: CRENSHAW,  MELISSA-MD   diphenhydrAMINE (diphenhydrAMINE 25 mg Cap)    25 mg, 1 caps, Oral, q6hr, PRN: other (see comment)    Status: Ordered Ordered: 05/02/21 Provider: PIETRO,  MELISSA-MD   diphenhydrAMINE (diphenhydrAMINE 50 mg/mL Inj Soln 1 mL)    25 mg, 0.5 mL, IV Push, q6hr, PRN: other (see comment)    Status: Ordered Ordered: 05/02/21 Provider: PIETRO,  MELISSA-MD   diphenhydrAMINE (diphenhydrAMINE 50 mg/mL Inj Soln 1 mL)    25 mg, 0.5 mL, IM, q6hr, PRN: other  (see comment)    Status: Ordered Ordered: 05/02/21 Provider: PIETRO HARTMANN   Dulcolax Laxative (bisacodyl 10 mg Rectal Supp)    10 mg, 1 supp,  PR, Daily, PRN: constipation    Status: Ordered Ordered: 05/02/21 Provider: PIETRO HARTMANN   Epsom Salt (magnesium sulfate Oral and Topical Powder-Recon 454 gm)    1 app, Topical, q8hr, PRN: perineal discomfort    Status: Ordered Ordered: 05/02/21 Provider: PIETRO,  MELISSA-MD   famotidine (famotidine 20 mg Tab)    20 mg, 1 tabs, Oral, Daily, PRN: indigestion    Status: Ordered Ordered: 05/02/21 Provider: PIETRO HARTMANN   hydrocortisone 2.5% rectal cream with applicator (hydrocortisone 2.5% Rectal Cream 28 gm)    1 app, Topical, QID, PRN: hemorrhoid discomfort    Status: Ordered Ordered: 05/02/21 Provider: PIETRO,  MELISSA-MD   ibuprofen (ibuprofen 800 mg Tab)    800 mg, 1 tabs, Oral, q8hr-INT    Status: Ordered Ordered: 05/02/21 Provider: PIETRO,  MELISSA-MD   lidocaine 1% injectable solution (lidocaine 1% Inj Soln 50 mL)    50 mL, IM, On Call, PRN: perineal discomfort    Status: Ordered Ordered: 05/01/21 Provider: PIETRO HARTMANN   Lomotil (atropine-diphenoxylate 0.025 mg-2.5 mg Tab)    2 tabs, Oral, On Call    Status: Ordered Ordered: 05/01/21 Provider: PIETRO HARTMANN   Lomotil (atropine-diphenoxylate 0.025 mg-2.5 mg Tab)    1 tabs, Oral, On Call    Status: Ordered Ordered: 05/01/21 Provider: CRENSHAW,  MELISSA-MD   methylergonovine POST DELIVERY ONLY (methylergonovine 0.2 mg/mL Inj Soln 1 mL)    0.2 mg, 1 mL, IM, On Call, PRN: bleeding    Status: Ordered Ordered: 05/01/21 Provider: PIETRO HARTMANN   methylergonovine POST DELIVERY ONLY (methylergonovine 0.2 mg Tab)    0.2 mg, 1 tabs, Oral, q4hr, PRN: bleeding    Status: Ordered Ordered: 05/01/21 Provider: PIETRO HARTMANN   mineral oil (mineral oil Oral Liquid 30 mL)    30 mL, Topical, On Call, PRN: other (see comment)    Status: Ordered Ordered: 05/01/21 Provider:  PIETRO HARTMANN   misoprostol POST DELIVERY ONLY (miSOPROStol  200 mcg Tab)    800 mcg, 4 tabs, PR, On Call, PRN: bleeding    Status: Ordered Ordered: 05/01/21 Provider: PIETRO,  MELISSA-MD   nalBUPHine (nalbuphine 10 mg/mL Inj Soln 1 mL)    5 mg, 0.5 mL, IV Push, q3hr, PRN: moderate pain (4-7)    Status: Ordered Ordered: 05/01/21 Provider: PIETRO,  MELISSA-MD   NIFEdipine (NIFEdipine 10 mg Cap)    10 mg, 1 caps, Oral, On Call, PRN: hypertension severe    Status: Ordered Ordered: 05/01/21 Provider: PIETRO,  MELISSA-MD   NIFEdipine (NIFEdipine 10 mg Cap)    10 mg, 1 caps, Oral, On Call, PRN: hypertension severe    Status: Ordered Ordered: 05/02/21 Provider: PIETRO,  MELISSA-MD   NS 1,000 mL (Sodium Chloride 0.9% intravenous solution 1,000 mL)    75 mL/hr, IV    Status: Ordered Ordered: 05/01/21 Provider: PIETRO,  MELISSA-MD   NS Bolus (Sodium Chloride 0.9% intravenous solution Bolus)    1,000 mL, 999 mL/hr, IV Bolus, On Call, PRN: other (see comment)    Status: Ordered Ordered: 05/01/21 Provider: PIETRO,  MELISSA-MD   ondansetron (ondansetron 4 mg Tab)    4 mg, 1 tabs, Oral, q6hr, PRN: nausea/vomiting    Status: Ordered Ordered: 05/02/21 Provider: PIETRO,  MELISSA-MD   ondansetron (ondansetron 2 mg/mL Inj Soln 2 mL)    4 mg, 2 mL, IV Push, q6hr, PRN: nausea/vomiting    Status: Ordered Ordered: 05/02/21 Provider: PIETRO HARTMANN   oxytocin IV additive 30 units + Sodium Chloride 0.9% 500 mL (  oxytocin 30 units + premix sodium chloride 0.9%..... 500 mL)    TITRATE, IV    Status: Ordered Ordered: 05/01/21 Provider: CRENSHAW,  MELISSA-MD   oxytocin IV additive 30 units + Sodium Chloride 0.9% 500 mL (oxytocin 30 units + premix sodium chloride 0.9%..... 500 mL)    500 mL/hr, IV Bolus    Status: Ordered Ordered: 05/01/21 Provider: CRENSHAW,  MELISSA-MD   oxytocin POST DELIVERY ONLY (oxytocin 10 units/mL Inj Soln 1 mL)    10 units, 1 mL, IM, On Call, PRN: bleeding    Status: Ordered Ordered:  05/01/21 Provider: PIETRO HARTMANN   Phenergan (promethazine 25 mg/mL Inj Soln 1 mL)    50 mg, 2 mL, IM, On Call    Status: Ordered Ordered: 05/01/21 Provider: CRENSHAW,  MELISSA-MD   promethazine (promethazine 25 mg Tab)    25 mg, 1 tabs, Oral, q6hr, PRN: nausea/vomiting    Status: Ordered Ordered: 05/02/21 Provider: PIETRO HARTMANN   promethazine (promethazine 25 mg/mL Inj Soln 1 mL)    25 mg, 1 mL, IM, q6hr, PRN: nausea/vomiting    Status: Ordered Ordered: 05/02/21 Provider: PIETRO,  MELISSA-MD   promethazine (promethazine 25 mg Rectal Supp)    25 mg, 1 supp, PR, q6hr, PRN: nausea/vomiting    Status: Ordered Ordered: 05/02/21 Provider: PIETRO,  MELISSA-MD   silver nitrate topical stick (silver nitrate Stick)    1 app, Topical, On Call, PRN: bleeding    Status: Ordered Ordered: 05/01/21 Provider: PIETRO,  MELISSA-MD   simethicone (simethicone 80 mg Chew Tab)    80 mg, 1 tabs, Chewed, QID, PRN: gas    Status: Ordered Ordered: 05/02/21 Provider: PIETRO,  MELISSA-MD   tetanus/diphth/pertuss (Tdap) adult/adol 5 units-2.5 units-18.5 mcg/0.5 mL intramuscular suspension (tetanus/diphth/pertuss (Tdap) adult/adol 5 units-2.5 units-18.5 mcg/0.5 mL IM Susp 0.5 mL)    0.5 mL, IM, Once, PRN: other (see comment)    Status: Ordered Ordered: 05/02/21 Provider: PIETRO HARTMANN   Tranexamic acid IVPB (for PPH) + Sodium Chloride 0.9% 100 mL (tranexamic acid 1000mg / 100 mL NS ADM + Premix ADM sodium chloride inj...SABRASABRASABRA 100 mL)    1,000 mg, 100 mL, 600 mL/hr, IV Piggyback, On Call, PRN: bleeding    Status: Ordered Ordered: 05/01/21 Provider: PIETRO HARTMANN   Zofran (ondansetron 2 mg/mL Inj Soln 2 mL)    4 mg, 2 mL, IV Push, q6hr, PRN: nausea/vomiting    Status: Ordered Ordered: 05/01/21 Provider: PIETRO HARTMANN    --------------------------------------------------------------------------------------------------------------------------------------------   Patty Tran  DOB:  Aug 18, 1991  Immunizations     No immunizations have been administered or recorded as given    --------------------------------------------------------------------------------------------------------------------------------------------   Patty Tran  DOB: January 19, 1991  Menstrual History  Last Recorded Menstrual Period:  --  Last Menstrual Period Description:  --  Menarche Onset:  --  Menarche Frequency:  --  Menarche Length:  --  Date of Menses Prior to Tran:  --  On Hormonal Contracept within 2 months of Tran:  --  Date of Home Pregnancy Test:  --  Comments:  --    --------------------------------------------------------------------------------------------------------------------------------------------   Patty Tran  DOB: 1991-10-08  Pregnancy History   G3 P2(2,0,0,2)       Pregnancy # 1          Baby 1             Outcome Date: 10/17/2015 Outcome or Result: Vaginal   Gest Age: 10 weeks  Birth Outcome: Live Birth   Birth Sex: --    Pregnancy # 2          Baby 1             Outcome Date: 09/20/2017 Outcome or Result: Vaginal   Gest Age: 52 weeks              Birth Outcome: Live Birth   Birth Sex: --    --------------------------------------------------------------------------------------------------------------------------------------------   Patty Tran  DOB: 1991/07/24  Medical History  Past Medical History   No active past medical history has been recorded    Family History   No active positive family history has been recorded    Procedure or Surgical History   Extraction of wisdom tooth   Age: -- Date: --    --------------------------------------------------------------------------------------------------------------------------------------------   Patty Tran  DOB: 1990/10/31  Social & Psychosocial History  Social History  Alcohol    Denies    Substance Abuse    Denies    Tobacco    Tobacco use: Never (less than 100 in lifetime).    Psychosocial  History  Family/Social  Father of Baby Involved: Yes      --------------------------------------------------------------------------------------------------------------------------------------------   Patty Tran  DOB: February 08, 1991  Infection History     Infection history negative or not recorded    --------------------------------------------------------------------------------------------------------------------------------------------   Patty Tran  DOB: [birth year]  Anesthesia and Transfusions  Prior Anesthesia or Transfusion Received:  Prior general anesthesia  Prior Anesthesia Reaction(s):  None  Prior Transfusion Reaction(s):  --  Blood Transfusion Acceptable to Patient:  Yes     --------------------------------------------------------------------------------------------------------------------------------------------   Patty Tran  DOB: 1991/01/25  Birth Plan and Patient Requests  Prenatal Education: Yes, with previous pregnancy  Written Birth Plan: --  Written Birth Plan Location: --  Oral Intake OB: --  Support Person/Coach Relationship to Pt: Husband  Labor Preferences: None  Non-Medicinal Pain Relief: None  Anesthesia/Pain Medication During Labor: Epidural  Delivery Plan: --  Infant Feeding: Exclusive breast milk  Circumcision: --  Baby For Adoption: No  Patient Requests: --  Pediatrician Selected: --  Surrogate Pregnancy: --  Support Person's Name: ESHAL, PROPPS Tran  DOB: [birth year]  Education    Prenatal Educational Materials/Leaflets Provided   No educational materials have been recorded as provided    Postpartum Patient Education   Activity Expectations: Verbalizes understanding (05/02/21 - Perantonakis,  Helena-RN)   Breast Care: Verbalizes understanding (05/03/21 - Gordan,  Olivia H-RN)   Cramps: Verbalizes understanding  (05/03/21 - Gordan,  Olivia H-RN)   Equipment/Devices: Verbalizes understanding (05/03/21 - Gordan,  Schuyler H-RN)   Incision/Episiotomy Care: Verbalizes understanding (05/03/21 - Gordan,  Olivia H-RN)   Lochia Changes: Verbalizes understanding (05/03/21 - Gordan,  Olivia H-RN)   Med Generic/Brand Name,Purpose,Action: Verbalizes understanding (05/02/21 - Perantonakis,  Helena-RN)   Pain Management: Verbalizes understanding (05/03/21 - Gordan,  Schuyler H-RN)   Perineal Care: Verbalizes understanding (05/03/21 - Gordan Schuyler H-RN)   Plan of Care: Verbalizes understanding (05/02/21 - Perantonakis,  Helena-RN)   Postpartum Booklet Given to Patient: Yes (05/03/21 [place] Gordan,  Schuyler H-RN)   Postpartum Depression: Verbalizes understanding (05/03/21 - Gordan Schuyler H-RN)   Safety: Verbalizes understanding (05/03/21 - Gordan Schuyler H-RN)   Safety, Fall: Verbalizes understanding (05/03/21 - Gordan,  Olivia H-RN)   [place]: Verbalizes understanding (05/03/21 - Gordan,  Olivia H-RN)    --------------------------------------------------------------------------------------------------------------------------------------------   Patty Tran  DOB: [birth year]  Registration and Pregnancy Information   Race:  White Ethnicity:  Not Hispanic or Latino   Marital Status:  Married   Language(s):  English   Religious Preference(s):  No Religious Pref   Occupation/Education:  See social history above if documented.    Address, Phone, and Health Plans   Home Address:  [address] [place], [place] [address]   Phone:  [phone] (Home), --, --     Health Plans:    1 - [employer]  [facility]    Member/Group:  [id number]    Deductible: $  -- Type:  [employer]    Address:  [address], [place], [place] [address]    Phone:  [phone]    General Prenatal Information   OB Provider(s) Information:  Not explicitly recorded. May be noted in prenatal encounter section below.   **See below for detailed information for all visits and  information.     Delivery Center/Hospital Information:  --   Newborn Provider Information:  --   Referring Provider Information:  JAMELLE MOATS S-MD   Primary Provider Information:  PCP,  NONE   Husband/Partner Information:  --   --   Support Person Information:  Ole  (Husband)   Planned/Unplanned Pregnancy:  --   Date Consent Signed for Tubal Ligation:  --   Date Prenatal Record Sent to Hospital:  --    --------------------------------------------------------------------------------------------------------------------------------------------   Patty Tran  DOB: 03/05/1991  Prenatal Visits and Encounters  (Known encounters since 11/09/2020. May include lab encounters.)  Date  Location Provider Type Medical Service  05/01/2021 01 DEFAULT,  DOCTOR [facility]PE-Inpatient [facility] MAT-Maternity  05/01/2021 01 CRENSHAW,  MELISSA-MD IPE-Inpatient [facility]Maternity  05/01/2021 01 SCHWARTZBERG,  HEATHER S- IPE-Inpatient [facility] MAT-Maternity  05/01/2021 01 SCHWARTZBERG,  HEATHER S- IPE-Inpatient [facility]Maternity  04/28/2021 Pending Bed Assignment SCHWARTZBERG,  HEATHER S- ORE-Scheduling [facility]aternity  04/18/2021 [facility]  04/18/2021 [facility]  03/08/2021 [facility]  03/03/2021 [facility]  12/08/2020 [facility]  11/09/2020 [facility]    --------------------------------------------------------------------------------------------------------------------------------------------   Patty Tran  DOB: 1991/09/08  Visit / Encounter Location Information  **The following information represents known location and contact information for locations visited during pregnancy   [facility] Address:          [address]          [place], [place]  [address]    [phone] (Business)     [facility]   Address:    No addresses found on file for location   Phone:    No phone numbers found on file for location    --------------------------------------------------------------------------------------------------------------------------------------------   Patty Tran, Patty Tran  DOB: 04-19-1991  Prenatal Visit Diagnosis   (Note:  All diagnoses documented since 11/09/2020)  05/01/21 - 37 WKS  (-- --, Date:         )  05/01/21 - 37 WKS  (-- --, Date:         )  05/01/21 - Single live birth  (ICD-10-CM: Z37.0, Date: 05/02/21)  05/01/21 - [redacted] weeks gestation of pregnancy  (ICD-10-CM: Z3A.37, Date: 05/01/21)  04/28/21 - HX OF COVID THIS PREGNANCY.39WEEK  (-- --, Date:         )  04/28/21 - HX OF COVID THIS PREGNANCY.39WEEK  (-- --, Date:         )  03/08/21 - Other abnormal glucose  (ICD-10-CM: R73.09, Date: 03/08/21)  03/08/21 - Other abnormal glucose  (ICD-10-CM: R73.09, Date: 03/08/21)  03/08/21 - Other abnormal glucose  (ICD-10-CM: R73.09, Date: 03/08/21)  03/08/21 - Other abnormal glucose  (ICD-10-CM: R73.09, Date: 03/08/21)  03/08/21 - Other abnormal glucose  (ICD-10-CM: R73.09, Date: 03/08/21)  03/03/21 - Encounter for screening for diabetes mellitus  (ICD-10-CM: Z13.1, Date: 03/03/21)  03/03/21 - Encounter for screening for diseases of the blood and blood-forming organs and certain disorders involving the immune mechanism  (ICD-10-CM: Z13.0, Date: 03/03/21)  12/08/20 - Encounter for supervision of other normal pregnancy, second trimester  (ICD-10-CM: Z34.82, Date: 12/08/20)  11/09/20 - Encounter for other screening for genetic and chromosomal anomalies  (ICD-10-CM: Z13.79, Date: 11/09/20)    --------------------------------------------------------------------------------------------------------------------------------------------   Patty Tran   DOB: 19-Sep-1991  Delivery Summary  A   Membrane Status Information    ROM Date/Time:  05/01/21 21:08:00    Amniotic Fluid Color/Description:  Clear (Modified)    Premature Rupture of Membranes:  No    Prolonged Rupture of Membranes:  No   Labor Information    Labor Onset Date/Time:  05/02/21 21:10:00    Length of Labor 1st Stage Hrs Calc:  19.55 hr    Length of Labor 1st Stage:  1173 minutes    2nd Stage Onset Date/Time:  05/02/21 01:37:00    Length of Labor 2nd Stage Hrs Calc:  0.38 hr    Length of Labor 2nd Stage:  23 minutes    Length of Labor 3rd Stage:  3 minutes    Labor Onset Methods:  Spontaneous, Augmented    Augmentation Methods:  Artificial rupture of membranes, Oxytocin infusion    Precipitous Labor:  No    Prolonged Labor:  No   Fetal Monitoring    FHR Monitoring Method:  Fetal scalp electrode   Delivery Information    Delivery Type:  Vaginal    Delivery of Head Date/Time:  05/02/21 02:00:00    Date/Time of Birth:  05/02/21 02:00:00    Placenta Delivery Date/Time:  05/02/21 02:03:00    Placenta Delivery Method:  Spontaneous    Placenta to Pathology:  No    Cord Blood Banking:  No    Cord Blood Sent to Lab:  Yes    Maternal Delivery Complications:  None    Delivery Physician:  PIETRO HARTMANN   Neonatal Information    Risk Factors:  None    Neonate Complications:  None    Umbilical Cord Description:  3 vessel cord   Infant Data    Gender:  Female    ID Band Number:  [id number]    Neonate Outcome:  Live birth    Security Tag Number:  [id number]    Birth Weight:  2.970 kg    Apgar Score 1 Minute:  8    Apgar Score 5 Minute:  9      --------------------------------------------------------------------------------------------------------------------------------------------  Note: Items documented with '--' had no clinical data which qualified at time of report creation     ***** END OF REPORT *****

## 2021-06-10 NOTE — Telephone Encounter (Signed)
PT DID NOT ANSWER NEEDS TO R/S A PP VISIT

## 2021-06-13 ENCOUNTER — Ambulatory Visit: Admit: 2021-06-13 | Discharge: 2021-06-13 | Payer: BLUE CROSS/BLUE SHIELD | Attending: Obstetrics & Gynecology

## 2021-06-13 NOTE — Telephone Encounter (Signed)
*  MIRENA/KYLEENA/SKYLA/LILETTA/PARAGARD IUD/INSERTION/REMOVAL IS COVERED AT 100% OF THE ALLOWABLE AFTER A $50.00 CO-PAY.  NO DEDUCTIBLE, OR PRIOR AUTH REQUIRED.  **NO DEVICE MAX, BASED ON MEDICAL NECESSITY**  REF# CRYSTAL [id number].  3:12 PM EST.  06-13-2021.

## 2021-06-13 NOTE — Telephone Encounter (Signed)
PT NEEDED PRECERT

## 2021-06-13 NOTE — Progress Notes (Signed)
Postpartum Visit    Postpartum depression screening: No data recorded   Previous Pap Results:  Diagnosis:   Date Value Ref Range Status   10/25/2020 Comment  Final     Comment:     NEGATIVE FOR INTRAEPITHELIAL LESION OR MALIGNANCY.     Specimen adequacy:   Date Value Ref Range Status   10/25/2020 Comment  Final     Comment:     Satisfactory for evaluation. Endocervical and/or squamous metaplastic  cells (endocervical component) are present.          Review the Delivery Report for details.     No current outpatient medications on file.     No current facility-administered medications for this visit.        No past medical history on file.   OB History    No obstetric history on file.         There were no vitals filed for this visit.      General Examination:  GENERAL APPEARANCE: pleasant, well nourished, well developed, in no acute distress, female.     Gynecological:  EXTERNAL GENITALIA: introitus normal, no lesions.   URETHRA: normal.   VAGINA: normal, healthy pink mucosa without any lesions, normal discharge.   CERVIX: normal appearance, no cervical movement tenderness, no lesions or discharge or bleeding.   UTERUS: normal mobility, nontender, normal size.   ADNEXA: no mass, nontender.     Assessment            Plan      There are no diagnoses linked to this encounter.   06/13/2021 postpartum visit, this is a 30 year old P3 6 weeks out from an SVD of a sign that she is breast-feeding with no depression.  She has a 50-year-old daughter and an approximately 43-year-old son.  They have completed childbearing and she is interested in the IUD and I explained the Mirena with the risk and benefits and the bleeding profile and the risk of infection/perforation/expulsion and migration with possible pregnancy.  We will work on getting her scheduled in the next month and I recommend a backup method of birth control until her IUD has been in place for 6 weeks.  Her husband is also considering vasectomy.  No follow-ups on  file.

## 2021-06-13 NOTE — Telephone Encounter (Signed)
IUD PRECERT THANK YOU DAWN

## 2021-06-14 ENCOUNTER — Encounter: Attending: Obstetrics & Gynecology

## 2021-06-14 NOTE — Telephone Encounter (Signed)
PT SCHEDULED

## 2021-06-15 NOTE — Progress Notes (Signed)
This encounter was created in error - please disregard.

## 2021-07-05 ENCOUNTER — Ambulatory Visit: Admit: 2021-07-05 | Discharge: 2021-07-05 | Payer: BLUE CROSS/BLUE SHIELD | Attending: Obstetrics & Gynecology

## 2021-07-05 DIAGNOSIS — Z30430 Encounter for insertion of intrauterine contraceptive device: Secondary | ICD-10-CM

## 2021-07-05 LAB — AMB POC URINE PREGNANCY TEST, VISUAL COLOR COMPARISON
HCG, Pregnancy, Urine, POC: NEGATIVE
Valid Internal Control, POC: NEGATIVE

## 2021-07-05 NOTE — Progress Notes (Signed)
Subjective:  Chief Complaints:       1.  IUD Removal and Reinsertion    HPI:         Patty Tran is a 30 y.o. female who is here today for IUD removal and reinsertion.  She was previously counseled concerning risks,benefits, side effects and alternatives.  Risks include infection, excess bleeding, perforation of uterus and internal organs, rejection.  All questions were answered.  She denies an active vaginal infection and pregnancy.    ROS:  RESPIRATORY:     Negative for shortness of breath, chest pain, chest congestion, cough.  CARDIOLOGY:    Negative for dizziness, chest pain, palpitations.  GASTROENTEROLOGY:    Negative for nausea, heartburn, vomiting, abdominal pain, constipation.  FEMALE REPRODUCTIVE:    Negative for abnormal vaginal discharge, abnormal vaginal bleeding.  UROLOGY:    Negative for difficulty urinating, voiding dysfunction, frequent urination, dysuria.  Gyn History:  Patient is currently sexually active.  Patients last menstrual period was .  Periods are currently regular.       OB History:    OB History   Gravida Para Term Preterm AB Living   4 3 3   1 3    SAB IAB Ectopic Molar Multiple Live Births   1         2      # Outcome Date GA Lbr Len/2nd Weight Sex Delivery Anes PTL Lv   4 Term 04/2021    M Vag-Spont   LIV   3 SAB 02/2019           2 Term 2018     Vag-Spont   LIV   1 Term 2016     Vag-Spont           Medications:  @CMEDBRIEF @    Objective:  Physical Examination:  GENERAL:     General: appearance:  well-appearing   HEART: normal   LUNGS:  clear bilaterally  ABDOMEN:  normal, soft.  GENITOURINARY - FEMALE:      External genitalia: normal, no lesions.  Urethra: normal external meatus. Vagina: unremarkable.  Cervix/cuff: normal appearance, no lesions/discharge/bleeding, IUD/IUS strings present/intact IUD removed intact with strings, without difficulty.  Uterus:  normal size/shape/consistency.  Adnexa: B/L nontender, no masses.      Assessment:  The encounter diagnosis was  Encounter for insertion of mirena IUD.     Plan:  Start Mirena device, 52mg , 1 ea, by intrauteral administration, once, 1 dose.  Notes:  Mirena place today without difficulty.  Educated that IUD strings need to be checked periodically.  Patient is to return for string checks if partner feels uncomfortable of pain due to strings.  Educated to continue annual exams and paps as recommended; follow up for recheck.  Patient counseled to notify provider with heavy bleeding, abdominal pain, fever or chills, missing strings.  Patient understands she may have cramping, spotting and breast tenderness for up to 6 months.    Procedures:    IUD Insertion:     Consent informed consent obtained, 30 second time out taken.  Prep: The cervix was prepped with betadine.  Procedure:  A single tooth tenaculum was placed, the uterus was sounded and found to be 10cm, the Mirena was inserted without difficulty, the strings were cut to 3cm in length.  Post Procedure:  The patient tolerated procedure well, instructed to take NSAIDs as directed for cramping, informed her that cramping and spotting are to be expected.        Orders  Placed This Encounter   Procedures    AMB POC URINE PREGNANCY TEST, VISUAL COLOR COMPARISON    PR MIRENA, 52 MG    PR INSERT INTRAUTERINE DEVICE                          07/05/2021 Mirena IUD insertion, 30 year old P3 who is about 8 weeks postpartum from her baby son.  She has a 1-year-old daughter.  She is completed childbearing.  She tolerated the IUD insertion well and recommend a backup method of birth control until we do a string check in her annual in about 6 weeks.  Precautions reviewed. LOT [id number] ecp nov 2024    06/13/2021 postpartum visit, this is a 30 year old P3 6 weeks out from an SVD of a sign that she is breast-feeding with no depression.  She has a 18-year-old daughter and an approximately 31-year-old son.  They have completed childbearing and she is interested in the IUD and I explained the Mirena with  the risk and benefits and the bleeding profile and the risk of infection/perforation/expulsion and migration with possible pregnancy.  We will work on getting her scheduled in the next month and I recommend a backup method of birth control until her IUD has been in place for 6 weeks.  Her husband is also considering vasectomy.  No follow-ups on file.

## 2021-07-18 MED ORDER — LEVONORGESTREL 20 MCG/DAY IU IUD
20 MCG/DAY | Freq: Once | INTRAUTERINE | Status: AC
Start: 2021-07-18 — End: 2021-07-18
  Administered 2021-07-18: 11:00:00 1 via INTRAUTERINE

## 2021-07-18 NOTE — Addendum Note (Signed)
Addended by: Charlott Rakes D on: 07/18/2021 06:34 AM     Modules accepted: Orders

## 2021-08-18 ENCOUNTER — Encounter: Payer: BLUE CROSS/BLUE SHIELD | Attending: Obstetrics & Gynecology

## 2021-09-27 ENCOUNTER — Encounter: Admit: 2021-09-27 | Discharge: 2021-09-27 | Payer: BLUE CROSS/BLUE SHIELD | Attending: Obstetrics & Gynecology

## 2021-09-27 DIAGNOSIS — Z01419 Encounter for gynecological examination (general) (routine) without abnormal findings: Secondary | ICD-10-CM

## 2021-09-27 NOTE — Progress Notes (Signed)
CC: Patient presents today for Annual Exam (No concerns)    HPI: see assessment and plan    OB History       Gravida   4    Para   3    Term   3    Preterm        AB   1    Living   3         SAB   1    IAB        Ectopic        Molar        Multiple        Live Births   3                GYN History     GYN History    Previous hysterectomy : No  Irregular cycles: no  Dysmenorrhea: no  No LMP recorded. (Menstrual status: IUD).  Currently sexually active: Yes  Any new sexual partners since last visit: no  Current Contraception:  IUD,  Content with this current method Yes  STD history: none  History of Abnormal Pap: Yes  Last Pap result: *NML  Diagnosis:   Date Value Ref Range Status   10/25/2020 Comment  Final     Comment:     NEGATIVE FOR INTRAEPITHELIAL LESION OR MALIGNANCY.     Specimen adequacy:   Date Value Ref Range Status   10/25/2020 Comment  Final     Comment:     Satisfactory for evaluation. Endocervical and/or squamous metaplastic  cells (endocervical component) are present.           Past Medical History:  Past Medical History:   Diagnosis Date    Gestational diabetes        Past Surgical History:  History reviewed. No pertinent surgical history.    Allergies:   No Known Allergies    Medication History:  Current Outpatient Medications   Medication Sig Dispense Refill    levonorgestrel (MIRENA, 52 MG,) IUD 52 mg 1 each by IntraUTERine route once       No current facility-administered medications for this visit.       Social History:  Social History     Socioeconomic History    Marital status: Married     Spouse name: Not on file    Number of children: Not on file    Years of education: Not on file    Highest education level: Not on file   Occupational History    Not on file   Tobacco Use    Smoking status: Never    Smokeless tobacco: Never   Vaping Use    Vaping Use: Never used   Substance and Sexual Activity    Alcohol use: Not Currently    Drug use: Never    Sexual activity: Yes   Other Topics Concern     Not on file   Social History Narrative    Not on file     Social Determinants of Health     Financial Resource Strain: Not on file   Food Insecurity: Not on file   Transportation Needs: Not on file   Physical Activity: Not on file   Stress: Not on file   Social Connections: Not on file   Intimate Partner Violence: Not on file   Housing Stability: Not on file       Family History:  Family History   Problem Relation Age of  Onset    Cancer Father        Objective:   BP 116/62    Ht 5\' 2"  (1.575 m)    Wt 113 lb 12.8 oz (51.6 kg)    BMI 20.81 kg/m??   Urine:@LASTPROCAMB (POC81001;POC81003;poc81002;poc81000;POC81001F;POC81003E)@  Hemoglobin: No components found for: [facility]   The patient appears well, alert, oriented x 3, in no distress.    PHYSICAL EXAM:    General Examination:  GENERAL APPEARANCE: alert, well hydrated, in no distress.   HEAD: normocephalic, atraumatic.   EYES: no lid lag, stare or proptosis, extraocular movement full and smooth.   NECK: thyroid normal.   RESPIRATORY: unlabored.   ABDOMEN: normal, soft, nontender, nondistended.   SKIN: normal, no suspicious lesions.   EXTREMITIES: no edema, normal, full range of motion.   NEUROLOGIC: alert and oriented, gait normal.       BREAST EXAM:    General Examination:  GENERAL APPEARANCE: pleasant, well nourished, well developed, in no acute distress.   NECK: no thyromegaly, neck supple.   LUNGS: good air movement.   BREASTS: no dimpling, no discharge, no masses palpable bilaterally, nontender.   ABDOMEN: soft, nontender, nondistended.            PELVIC EXAM:   Examination:   Gynecological:  EXTERNAL GENITALIA: normal, introitus normal.   URETHRA: meatus normal.   BLADDER: normal.   VAGINA: healthy pink mucosa without any lesions.   CERVIX: no cervical movement tenderness, no lesions.   UTERUS: normal mobility, nontender.   ADNEXA: no mass, nontender.   ANUS/PERINEUM: normal.                Assessment/Plan:    09/27/2021 annual, 14/03/2021 is a 30 year old P3 whose baby  son is now about 38 months old.  She is doing well with her Mirena IUD inserted on 06/2021 with rare cycles and she has completed childbearing.  She is without complaints and Pap smear performed.  Monthly SBE recommended.  Mammogram at age 30.  Healthy lifestyle reviewed.      07/05/2021 Mirena IUD insertion, 30 year old P3 who is about 8 weeks postpartum from her baby son.  She has a 66-year-old daughter.  She is completed childbearing.  She tolerated the IUD insertion well and recommend a backup method of birth control until we do a string check in her annual in about 6 weeks.  Precautions reviewed. LOT [id number] ecp nov 2024     06/13/2021 postpartum visit, this is a 30 year old P3 6 weeks out from an SVD of a sign that she is breast-feeding with no depression.  She has a 29-year-old daughter and an approximately 42-year-old son.  They have completed childbearing and she is interested in the IUD and I explained the Mirena with the risk and benefits and the bleeding profile and the risk of infection/perforation/expulsion and migration with possible pregnancy.  We will work on getting her scheduled in the next month and I recommend a backup method of birth control until her IUD has been in place for 6 weeks.  Her husband is also considering vasectomy.  No follow-ups on file.

## 2021-10-03 LAB — PAP IG, LIQUID-BASED RFX APTIMA HPV WHEN HPV ASCU 16/18,45 (199340): .: 0

## 2021-10-28 ENCOUNTER — Emergency Department (HOSPITAL_BASED_OUTPATIENT_CLINIC_OR_DEPARTMENT_OTHER)
Admission: EM | Admit: 2021-10-28 | Discharge: 2021-10-28 | Disposition: A | Discharge status: Home / Self Care | Payer: Medicaid Other | Attending: Emergency Medicine | Admitting: Emergency Medicine

## 2021-10-28 ENCOUNTER — Encounter (HOSPITAL_BASED_OUTPATIENT_CLINIC_OR_DEPARTMENT_OTHER): Payer: Self-pay | Admitting: *Deleted

## 2021-10-28 ENCOUNTER — Other Ambulatory Visit: Payer: Self-pay

## 2021-10-28 ENCOUNTER — Emergency Department (HOSPITAL_BASED_OUTPATIENT_CLINIC_OR_DEPARTMENT_OTHER): Payer: Medicaid Other

## 2021-10-28 DIAGNOSIS — M542 Cervicalgia: Secondary | ICD-10-CM | POA: Diagnosis not present

## 2021-10-28 DIAGNOSIS — R519 Headache, unspecified: Secondary | ICD-10-CM | POA: Insufficient documentation

## 2021-10-28 DIAGNOSIS — R202 Paresthesia of skin: Secondary | ICD-10-CM | POA: Insufficient documentation

## 2021-10-28 HISTORY — DX: Essential (primary) hypertension: I10

## 2021-10-28 LAB — CBC WITH DIFFERENTIAL/PLATELET
Abs Immature Granulocytes: 0.01 10*3/uL (ref 0.00–0.07)
Basophils Absolute: 0 10*3/uL (ref 0.0–0.1)
Basophils Relative: 1 %
Eosinophils Absolute: 0.1 10*3/uL (ref 0.0–0.5)
Eosinophils Relative: 1 %
HCT: 40.5 % (ref 36.0–46.0)
Hemoglobin: 13.2 g/dL (ref 12.0–15.0)
Immature Granulocytes: 0 %
Lymphocytes Relative: 34 %
Lymphs Abs: 2.4 10*3/uL (ref 0.7–4.0)
MCH: 29.2 pg (ref 26.0–34.0)
MCHC: 32.6 g/dL (ref 30.0–36.0)
MCV: 89.6 fL (ref 80.0–100.0)
Monocytes Absolute: 0.5 10*3/uL (ref 0.1–1.0)
Monocytes Relative: 8 %
Neutro Abs: 4 10*3/uL (ref 1.7–7.7)
Neutrophils Relative %: 56 %
Platelets: 252 10*3/uL (ref 150–400)
RBC: 4.52 MIL/uL (ref 3.87–5.11)
RDW: 14.1 % (ref 11.5–15.5)
WBC: 7 10*3/uL (ref 4.0–10.5)
nRBC: 0 % (ref 0.0–0.2)

## 2021-10-28 LAB — BASIC METABOLIC PANEL
Anion gap: 9 (ref 5–15)
BUN: 8 mg/dL (ref 6–20)
CO2: 21 mmol/L — ABNORMAL LOW (ref 22–32)
Calcium: 9.1 mg/dL (ref 8.9–10.3)
Chloride: 106 mmol/L (ref 98–111)
Creatinine, Ser: 0.86 mg/dL (ref 0.44–1.00)
GFR, Estimated: >60 mL/min (ref >60)
Glucose, Bld: 90 mg/dL (ref 70–99)
Potassium: 3.6 mmol/L (ref 3.5–5.1)
Sodium: 136 mmol/L (ref 135–145)

## 2021-10-28 LAB — HCG, SERUM, QUALITATIVE: Preg, Serum: NEGATIVE

## 2021-10-28 LAB — TROPONIN I (HIGH SENSITIVITY): Troponin I (High Sensitivity): <2 ng/L (ref <18)

## 2021-10-28 MED ORDER — IOHEXOL 350 MG/ML SOLN
80.0000 mL | Freq: Once | INTRAVENOUS | Status: AC | PRN
  Administered 2021-10-28: 80 mL via INTRAVENOUS

## 2021-10-28 NOTE — ED Triage Notes (Signed)
Headache for a week. Left side numbness since this am. She feels like she is going to pass out. Pt is ambulatory.

## 2021-10-28 NOTE — ED Provider Notes (Signed)
[facility] Provider Note   CSN: [id number] Arrival date & time: 10/28/21  1542     History  Chief Complaint  Patient presents with   Headache    Tammy Callahan is a 31 y.o. female.  Presented to ER with concern for headache.  Patient reports that she has had headache for about 1 week.  Moderate to severe in nature, comes and goes, no obvious alleviating or aggravating factors.  Currently moderate.  Aching, frontal, left-sided.  Also having some pain in the left side of her neck.  States that this morning she had numb/tingly sensation in her left arm.  This has since seemed to have gone away.  No associated vision change or speech change.  No chest pain or difficulty in breathing.  Earlier today had episode feeling like she was going to pass out but did not pass out.  Does not feel lightheaded at present.    HPI     Home Medications Prior to Admission medications   Medication Sig Start Date End Date Taking? Authorizing Provider  amLODipine (NORVASC) 10 MG tablet Take 1 tablet by mouth daily. 05/19/21  Yes [provider]  methocarbamol (ROBAXIN) 500 MG tablet Take 1 tablet (500 mg total) by mouth every 8 (eight) hours as needed for muscle spasms. 08/01/19   Petrucelli, Samantha R, PA-C  naproxen (NAPROSYN) 500 MG tablet Take 1 tablet (500 mg total) by mouth 2 (two) times daily. 08/01/19   Petrucelli, Glynda Jaeger, PA-C      Allergies    Patient has no known allergies.    Review of Systems   Review of Systems  Constitutional:  Positive for fatigue. Negative for chills and fever.  HENT:  Negative for ear pain and sore throat.   Eyes:  Negative for pain and visual disturbance.  Respiratory:  Negative for cough and shortness of breath.   Cardiovascular:  Negative for chest pain and palpitations.  Gastrointestinal:  Negative for abdominal pain and vomiting.  Genitourinary:  Negative for dysuria and hematuria.  Musculoskeletal:  Negative for  arthralgias and back pain.  Skin:  Negative for color change and rash.  Neurological:  Positive for numbness and headaches. Negative for seizures and syncope.  All other systems reviewed and are negative.  Physical Exam Updated Vital Signs BP (!) 140/96    Pulse 65    Temp 98.1 F (36.7 C) (Oral)    Resp 16    Ht 5\' 7"  (1.702 m)    Wt 92.5 kg    SpO2 100%    BMI 31.95 kg/m  Physical Exam  ED Results / Procedures / Treatments   Labs (all labs ordered are listed, but only abnormal results are displayed) Labs Reviewed  BASIC METABOLIC PANEL - Abnormal; Notable for the following components:      Result Value   CO2 21 (*)    All other components within normal limits  CBC WITH DIFFERENTIAL/PLATELET  HCG, SERUM, QUALITATIVE  TROPONIN I (HIGH SENSITIVITY)  TROPONIN I (HIGH SENSITIVITY)    EKG EKG Interpretation  Date/Time:  Friday October 28 2021 16:14:17 EST Ventricular Rate:  89 PR Interval:  144 QRS Duration: 78 QT Interval:  336 QTC Calculation: 408 R Axis:   54 Text Interpretation: Normal sinus rhythm with sinus arrhythmia Septal infarct , age undetermined Abnormal ECG No previous ECGs available Confirmed by Madalyn Rob [phone]) on 10/28/2021 5:33:44 PM  Radiology CT ANGIO HEAD NECK W WO CM  Result Date: 10/28/2021 CLINICAL  DATA:  Headache and left-sided numbness EXAM: CT ANGIOGRAPHY HEAD AND NECK TECHNIQUE: Multidetector CT imaging of the head and neck was performed using the standard protocol during bolus administration of intravenous contrast. Multiplanar CT image reconstructions and MIPs were obtained to evaluate the vascular anatomy. Carotid stenosis measurements (when applicable) are obtained utilizing NASCET criteria, using the distal internal carotid diameter as the denominator. CONTRAST:  15mL OMNIPAQUE IOHEXOL 350 MG/ML SOLN COMPARISON:  05/02/2021 FINDINGS: CT HEAD FINDINGS Brain: There is no mass, hemorrhage or extra-axial collection. The size and configuration of  the ventricles and extra-axial CSF spaces are normal. There is no acute or chronic infarction. The brain parenchyma is normal. Skull: The visualized skull base, calvarium and extracranial soft tissues are normal. Sinuses/Orbits: No fluid levels or advanced mucosal thickening of the visualized paranasal sinuses. No mastoid or middle ear effusion. The orbits are normal. CTA NECK FINDINGS SKELETON: There is no bony spinal canal stenosis. No lytic or blastic lesion. OTHER NECK: Normal pharynx, larynx and major salivary glands. No cervical lymphadenopathy. Unremarkable thyroid gland. UPPER CHEST: No pneumothorax or pleural effusion. No nodules or masses. AORTIC ARCH: There is no calcific atherosclerosis of the aortic arch. There is no aneurysm, dissection or hemodynamically significant stenosis of the visualized portion of the aorta. Conventional 3 vessel aortic branching pattern. The visualized proximal subclavian arteries are widely patent. RIGHT CAROTID SYSTEM: Normal without aneurysm, dissection or stenosis. LEFT CAROTID SYSTEM: Normal without aneurysm, dissection or stenosis. VERTEBRAL ARTERIES: Left dominant configuration. Both origins are clearly patent. There is no dissection, occlusion or flow-limiting stenosis to the skull base (V1-V3 segments). CTA HEAD FINDINGS POSTERIOR CIRCULATION: --Vertebral arteries: Normal V4 segments. --Inferior cerebellar arteries: Normal. --Basilar artery: Normal. --Superior cerebellar arteries: Normal. --Posterior cerebral arteries (PCA): Normal. ANTERIOR CIRCULATION: --Intracranial internal carotid arteries: Normal. --Anterior cerebral arteries (ACA): Normal. Both A1 segments are present. Patent anterior communicating artery (a-comm). --Middle cerebral arteries (MCA): Normal. VENOUS SINUSES: As permitted by contrast timing, patent. ANATOMIC VARIANTS: Fetal origin of the left posterior cerebral artery. Review of the MIP images confirms the above findings. IMPRESSION: Normal CTA of  the head and neck. Electronically Signed   By: Ulyses Jarred M.D.   On: 10/28/2021 19:29    Procedures Procedures    Medications Ordered in ED Medications  iohexol (OMNIPAQUE) 350 MG/ML injection 80 mL (80 mLs Intravenous Contrast Given 10/28/21 1908)    ED Course/ Medical Decision Making/ A&P                           Medical Decision Making  31 year old lady presented to ER with concern for headache and sensation of numbness/tingling in her left arm.  On exam patient appears well in no acute distress.  Currently she does not have any focal neurologic deficits.  Her vital signs are grossly within normal limits.  Obtain CTA head/neck to evaluate for possibility of stroke, dissection, SAH, aneurysm.  Reviewed CT imaging, reviewed radiology report - no acute findings identified on CT.  Basic lab work including BMP, CBC is grossly within normal limits.  On reassessment, patient denies ongoing symptoms.  Given lack of ongoing symptoms, reassuring work-up as above, do not feel she needs additional testing or admission at this time.  Recommend she follow-up with her primary care doctor, reviewed return precautions and discharged.    After the discussed management above, the patient was determined to be safe for discharge.  The patient was in agreement with this plan and  all questions regarding their care were answered.  ED return precautions were discussed and the patient will return to the ED with any significant worsening of condition.         Final Clinical Impression(s) / ED Diagnoses Final diagnoses:  Nonintractable headache, unspecified chronicity pattern, unspecified headache type  Tingling in extremities    Rx / DC Orders ED Discharge Orders     None         Lucrezia Starch, MD 10/28/21 2342

## 2021-10-28 NOTE — Discharge Instructions (Signed)
Please follow-up with your primary care doctor to discuss your episode today.  Come back to ER if you develop any new or recurrent weakness, numbness, severe headache, vomiting, speech or vision change, or other new concerning symptoms.

## 2021-12-19 NOTE — Telephone Encounter (Signed)
Spoke with pt, she is advised this is normal but if this does not get better in a month we will do a VV.

## 2021-12-19 NOTE — Telephone Encounter (Signed)
Pt stated that she currently has the Mirena. Pt states that she has been spotting since December. Pt stated that on Friday 02/24 she stated having her full cycle. Pt would like to know if that is normal. Pt would like a call back to explain her situation a little more and get some help. Please cb pt. Please assist.

## 2022-01-20 NOTE — Telephone Encounter (Signed)
Appt scheduled for 4/6 @ 10am    FYI

## 2022-01-20 NOTE — Telephone Encounter (Signed)
Pt has Mirena and she was told if she started bleeding heavy again to contact the office. Please assist

## 2022-01-20 NOTE — Telephone Encounter (Signed)
Patient States she has had the mirena for about 6 months and is continuing to have heavy periods. Patient sttes she goes thru a super tampon each hour. I recommended an office visit with Dr Rivka Safer to discuss. ucss. Patient verbalized understadning     Patty Tran could youn please call and offer pt appt for next Thursday with Dr Rivka Safer? I would appreciate it. 10 minutes is fine

## 2022-01-26 ENCOUNTER — Ambulatory Visit: Admit: 2022-01-26 | Discharge: 2022-01-26 | Payer: BLUE CROSS/BLUE SHIELD | Attending: Obstetrics & Gynecology

## 2022-01-26 DIAGNOSIS — T8332XA Displacement of intrauterine contraceptive device, initial encounter: Secondary | ICD-10-CM

## 2022-01-26 LAB — AMB POC URINE PREGNANCY TEST, VISUAL COLOR COMPARISON
HCG, Pregnancy, Urine, POC: NEGATIVE
Valid Internal Control, POC: NEGATIVE

## 2022-01-26 MED ORDER — NORGESTIMATE-ETH ESTRADIOL 0.25-35 MG-MCG PO TABS
ORAL_TABLET | Freq: Every day | ORAL | 3 refills | Status: AC
Start: 2022-01-26 — End: 2022-04-20

## 2022-01-26 NOTE — Progress Notes (Signed)
CC: Patient presents today for Follow-up (Pt. Here with c/o spotting with IUD)    HPI: see assessment and plan    OB History       Gravida   4    Para   3    Term   3    Preterm        AB   1    Living   3         SAB   1    IAB        Ectopic        Molar        Multiple        Live Births   3                GYN History     Past Medical History:  Past Medical History:   Diagnosis Date    Gestational diabetes        Past Surgical History:  Past Surgical History:   Procedure Laterality Date    EYE SURGERY         Allergies:   No Known Allergies    Medication History:  Current Outpatient Medications   Medication Sig Dispense Refill    norgestimate-ethinyl estradiol (SPRINTEC 28) 0.25-35 MG-MCG per tablet Take 1 tablet by mouth daily 84 tablet 3    levonorgestrel (MIRENA, 52 MG,) IUD 52 mg 1 each by IntraUTERine route once       No current facility-administered medications for this visit.       Social History:  Social History     Socioeconomic History    Marital status: Married     Spouse name: Not on file    Number of children: Not on file    Years of education: Not on file    Highest education level: Not on file   Occupational History    Not on file   Tobacco Use    Smoking status: Never    Smokeless tobacco: Never   Vaping Use    Vaping Use: Never used   Substance and Sexual Activity    Alcohol use: Not Currently    Drug use: Never    Sexual activity: Yes     Partners: Male   Other Topics Concern    Not on file   Social History Narrative    Not on file     Social Determinants of Health     Financial Resource Strain: Not on file   Food Insecurity: Not on file   Transportation Needs: Not on file   Physical Activity: Not on file   Stress: Not on file   Social Connections: Not on file   Intimate Partner Violence: Not on file   Housing Stability: Not on file       Family History:  Family History   Problem Relation Age of Onset    Cancer Father        Objective:   BP 112/66   Ht 5\' 2"  (1.575 m)   Wt 111 lb 8 oz (50.6  kg)   LMP 01/16/2022 Comment: currently IUD birth control  BMI 20.39 kg/m   Urine:@LASTPROCAMB (POC81001;POC81003;poc81002;poc81000;POC81001F;POC81003E)@  Hemoglobin: No components found for: [facility]   General Examination:  GENERAL APPEARANCE: alert, well hydrated, in no distress .   HEAD: normocephalic, atraumatic.   EYES: no lid lag, stare or proptosis.   SKIN: normal.   NEUROLOGIC: alert and oriented, gait normal.   PSYCH: alert, oriented, cognitive  function intact, cooperative with exam, good eye contact, mood/affect appropriate      BREAST EXAM: not examined    PELVIC EXAM:   Examination:   Gynecological:  EXTERNAL GENITALIA: normal, introitus normal.   URETHRA: meatus normal.   BLADDER: normal.   VAGINA: healthy pink mucosa without any lesions.   CERVIX: no cervical movement tenderness, no lesions.   UTERUS: normal mobility, mild ttp  ADNEXA: no mass, nontender.   ANUS/PERINEUM: normal.                  Assessment/Plan:       ICD-10-CM    1. Intrauterine contraceptive device threads lost, initial encounter  T83.32XA [place] NON OB TRANSVAGINAL     AMB POC URINE PREGNANCY TEST, VISUAL COLOR COMPARISON      2. Pelvic pain  R10.2 AMB POC URINE PREGNANCY TEST, VISUAL COLOR COMPARISON      3. Metrorrhagia  N92.1                01/26/22 problem visit, [name] who son is now 76 months old.  I placed a Mirena IUD back in September and her husband also had a vasectomy.  She did not come back for 6 weeks string check but I did see her in December for her annual and she was having rare scant cycles and happy with her IUD.  Now she is having bleeding every 3 weeks which is very heavy and painful with cramping and I am unable to see her IUD string.  I will schedule her ultrasound and I sent in a year prescription of Sprintec to get started on and will check a pregnancy test in the office since her partner never did get his semen analysis after the procedure.  I did explain the risk of expulsion and migration as  well    09/27/2021 annual, Patty Tran is a 74 year old P3 whose baby son is now about 53 months old.  She is doing well with her Mirena IUD inserted on 06/2021 with rare cycles and she has completed childbearing.  She is without complaints and Pap smear performed.  Monthly SBE recommended.  Mammogram at age 49.  Healthy lifestyle reviewed.       07/05/2021 Mirena IUD insertion, 31 year old P3 who is about 8 weeks postpartum from her baby son.  She has a 35-year-old daughter.  She is completed childbearing.  She tolerated the IUD insertion well and recommend a backup method of birth control until we do a string check in her annual in about 6 weeks.  Precautions reviewed. LOT [id number] ecp nov 2024     06/13/2021 postpartum visit, this is a 31 year old P3 6 weeks out from an SVD of a sign that she is breast-feeding with no depression.  She has a 41-year-old daughter and an approximately 6-year-old son.  They have completed childbearing and she is interested in the IUD and I explained the Mirena with the risk and benefits and the bleeding profile and the risk of infection/perforation/expulsion and migration with possible pregnancy.  We will work on getting her scheduled in the next month and I recommend a backup method of birth control until her IUD has been in place for 6 weeks.  Her husband is also considering vasectomy.  No follow-ups on file.

## 2022-02-09 ENCOUNTER — Ambulatory Visit: Admit: 2022-02-09 | Discharge: 2022-02-09 | Payer: BLUE CROSS/BLUE SHIELD

## 2022-02-09 ENCOUNTER — Ambulatory Visit: Admit: 2022-02-09 | Discharge: 2022-02-09 | Payer: BLUE CROSS/BLUE SHIELD | Attending: Obstetrics & Gynecology

## 2022-02-09 DIAGNOSIS — T8332XA Displacement of intrauterine contraceptive device, initial encounter: Secondary | ICD-10-CM

## 2022-02-09 DIAGNOSIS — N921 Excessive and frequent menstruation with irregular cycle: Secondary | ICD-10-CM

## 2022-02-09 NOTE — Progress Notes (Signed)
CC: Patient presents today for Follow-up (Pt is here for US and f/u for IUD location//rm)    HPI: see assessment and plan    OB History       Gravida   4    Para   3    Term   3    Preterm        AB   1    Living   3         SAB   1    IAB        Ectopic        Molar        Multiple        Live Births   3                GYN History     Past Medical History:  Past Medical History:   Diagnosis Date    Gestational diabetes        Past Surgical History:  Past Surgical History:   Procedure Laterality Date    EYE SURGERY         Allergies:   No Known Allergies    Medication History:  Current Outpatient Medications   Medication Sig Dispense Refill    norgestimate-ethinyl estradiol (SPRINTEC 28) 0.25-35 MG-MCG per tablet Take 1 tablet by mouth daily 84 tablet 3    levonorgestrel (MIRENA, 52 MG,) IUD 52 mg 1 each by IntraUTERine route once       No current facility-administered medications for this visit.       Social History:  Social History     Socioeconomic History    Marital status: Married     Spouse name: Not on file    Number of children: Not on file    Years of education: Not on file    Highest education level: Not on file   Occupational History    Not on file   Tobacco Use    Smoking status: Never    Smokeless tobacco: Never   Vaping Use    Vaping Use: Never used   Substance and Sexual Activity    Alcohol use: Not Currently    Drug use: Never    Sexual activity: Yes     Partners: Male   Other Topics Concern    Not on file   Social History Narrative    Not on file     Social Determinants of Health     Financial Resource Strain: Not on file   Food Insecurity: Not on file   Transportation Needs: Not on file   Physical Activity: Not on file   Stress: Not on file   Social Connections: Not on file   Intimate Partner Violence: Not on file   Housing Stability: Not on file       Family History:  Family History   Problem Relation Age of Onset    Cancer Father        Objective:   BP 112/69   Wt 110 lb (49.9 kg)   LMP  01/16/2022 Comment: currently IUD birth control  BMI 20.12 kg/m   Urine:@LASTPROCAMB (POC81001;POC81003;poc81002;poc81000;POC81001F;POC81003E)@  Hemoglobin: [facility]: [facility]   General Examination:  GENERAL APPEARANCE: alert, well hydrated, in no distress .   HEAD: normocephalic, atraumatic.   EYES: no lid lag, stare or proptosis.   SKIN: normal.   NEUROLOGIC: alert and oriented, gait normal.   PSYCH: alert, oriented, cognitive function intact, cooperative with exam, good  eye contact, mood/affect appropriate      BREAST EXAM: not examined    PELVIC EXAM: not examined    Assessment/Plan:       ICD-10-CM    1. Metrorrhagia  N92.1       2. Intrauterine contraceptive device threads lost, subsequent encounter  T83.32XD XR ABDOMEN (KUB) (SINGLE AP VIEW)      3. Relies on partner's vasectomy for primary method of contraception  Z78.9                     02/09/22 ultrasound follow-up-[name] whose baby son is now 72 months old.  I placed a Mirena IUD 06/2022 and her husband has a vasectomy.  When I saw her for her annual about 3 months later she was having rare cycles and happy with her IUD.  She presented a few weeks ago complaining of worsening menstrual cycles and I could not see her string.  She states that she had significant cramping back in the end of December and then her cycles came back on after that.  Ultrasound today does not show the presence of an IUD and her uterus measures 5.8 cm with a 4 mm stripe and normal ovaries and trace free fluid.  I will schedule a abdominal x-ray to look for a possibly migrated IUD.  She is wanting treatment for her menorrhagia and we discussed the options of endometrial ablation and she would like to proceed with this.  We will first get the x-ray to make sure that she does not have an intra-abdominal IUD that would need to be removed.I did call radiology to discuss the best method of modality identifying and extrauterine IUD and have placed the  order      01/26/22 problem visit, Patty Tran is a 57 year old P3 who son is now 36 months old.  I placed a Mirena IUD back in September and her husband also had a vasectomy.  She did not come back for 6 weeks string check but I did see her in December for her annual and she was having rare scant cycles and happy with her IUD.  Now she is having bleeding every 3 weeks which is very heavy and painful with cramping and I am unable to see her IUD string.  I will schedule her ultrasound and I sent in a year prescription of Sprintec to get started on and will check a pregnancy test in the office since her partner never did get his semen analysis after the procedure.  I did explain the risk of expulsion and migration as well     09/27/2021 annual, Patty Tran is a 39 year old P3 whose baby son is now about 16 months old.  She is doing well with her Mirena IUD inserted on 06/2021 with rare cycles and she has completed childbearing.  She is without complaints and Pap smear performed.  Monthly SBE recommended.  Mammogram at age 74.  Healthy lifestyle reviewed.       07/05/2021 Mirena IUD insertion, 31 year old P3 who is about 8 weeks postpartum from her baby son.  She has a 37-year-old daughter.  She is completed childbearing.  She tolerated the IUD insertion well and recommend a backup method of birth control until we do a string check in her annual in about 6 weeks.  Precautions reviewed. LOT [id number] ecp nov 2024     06/13/2021 postpartum visit, this is a 31 year old P3 6 weeks out from an SVD of a sign that  she is breast-feeding with no depression.  She has a 5-year-old daughter and an approximately 71-year-old son.  They have completed childbearing and she is interested in the IUD and I explained the Mirena with the risk and benefits and the bleeding profile and the risk of infection/perforation/expulsion and migration with possible pregnancy.  We will work on getting her scheduled in the next month and I recommend a backup method of  birth control until her IUD has been in place for 6 weeks.  Her husband is also considering vasectomy.  No follow-ups on file.

## 2022-02-10 NOTE — Telephone Encounter (Signed)
-----   Message from Judithe Modest, MD sent at 02/09/2022  2:29 PM EDT -----  Patient will need an endometrial ablation with the preop.  On first getting an x-ray of her abdomen to see if we can find a lost IUD as if it is in her pelvis then we will need to schedule laparoscopy to remove it.  So please been a look out for that x-ray in the next week.  Thanks

## 2022-02-16 NOTE — Telephone Encounter (Signed)
Called pt. Patient states she has not heard from scheduling re: xray. Patient to call scheduling and schedule xray today or tomorrow.

## 2022-02-28 NOTE — Telephone Encounter (Signed)
Called patient and pt states she will go get xray soon.

## 2022-06-05 NOTE — Telephone Encounter (Signed)
05/05/22, 12:25pm  Spoke to patient she stated no pain but odor from urine. Suggested she go to Urgent care or her PCP TODAY! Patient stated she would go to urgent care today

## 2022-06-06 NOTE — Telephone Encounter (Signed)
Patient stated she is returning [name]'s call regarding this medication refill.

## 2022-09-29 ENCOUNTER — Encounter: Attending: Obstetrics & Gynecology

## 2022-09-29 NOTE — Progress Notes (Unsigned)
02/09/22 ultrasound follow-up-[name] whose baby son is now 29 months old.  I placed a Mirena IUD 06/2022 and her husband has a vasectomy.  When I saw her for her annual about 3 months later she was having rare cycles and happy with her IUD.  She presented a few weeks ago complaining of worsening menstrual cycles and I could not see her string.  She states that she had significant cramping back in the end of December and then her cycles came back on after that.  Ultrasound today does not show the presence of an IUD and her uterus measures 5.8 cm with a 4 mm stripe and normal ovaries and trace free fluid.  I will schedule a abdominal x-ray to look for a possibly migrated IUD.  She is wanting treatment for her menorrhagia and we discussed the options of endometrial ablation and she would like to proceed with this.  We will first get the x-ray to make sure that she does not have an intra-abdominal IUD that would need to be removed.I did call radiology to discuss the best method of modality identifying and extrauterine IUD and have placed the order        01/26/22 problem visit, Patty Tran is a 3 year old P3 who son is now 31 months old.  I placed a Mirena IUD back in September and her husband also had a vasectomy.  She did not come back for 6 weeks string check but I did see her in December for her annual and she was having rare scant cycles and happy with her IUD.  Now she is having bleeding every 3 weeks which is very heavy and painful with cramping and I am unable to see her IUD string.  I will schedule her ultrasound and I sent in a year prescription of Sprintec to get started on and will check a pregnancy test in the office since her partner never did get his semen analysis after the procedure.  I did explain the risk of expulsion and migration as well     09/27/2021 annual, Patty Tran is a 62 year old P3 whose baby son is now about 79 months old.  She is doing well with her Mirena IUD inserted on 06/2021  with rare cycles and she has completed childbearing.  She is without complaints and Pap smear performed.  Monthly SBE recommended.  Mammogram at age 59.  Healthy lifestyle reviewed.       07/05/2021 Mirena IUD insertion, 31 year old P3 who is about 8 weeks postpartum from her baby son.  She has a 33-year-old daughter.  She is completed childbearing.  She tolerated the IUD insertion well and recommend a backup method of birth control until we do a string check in her annual in about 6 weeks.  Precautions reviewed. LOT [id number] ecp nov 2024     06/13/2021 postpartum visit, this is a 31 year old P3 6 weeks out from an SVD of a sign that she is breast-feeding with no depression.  She has a 89-year-old daughter and an approximately 34-year-old son.  They have completed childbearing and she is interested in the IUD and I explained the Mirena with the risk and benefits and the bleeding profile and the risk of infection/perforation/expulsion and migration with possible pregnancy.  We will work on getting her scheduled in the next month and I recommend a backup method of birth control until her IUD has been in place for 6 weeks.  Her husband is also considering vasectomy.  No follow-ups on  file

## 2023-05-24 IMAGING — CT CT ANGIO HEAD-NECK (W OR W/O PERF)
1 of 11 series · 5 of 34 positions shown · IV contrast (omnipaque)
Comparison: 05/02/2021

CLINICAL DATA: Headache and left-sided numbness



[Series 11: axial thin · axial · 0.62mm/px · z∈[+811,+1051]mm · 5 of 360 slices shown]
[im 60/360  soft-tissue]
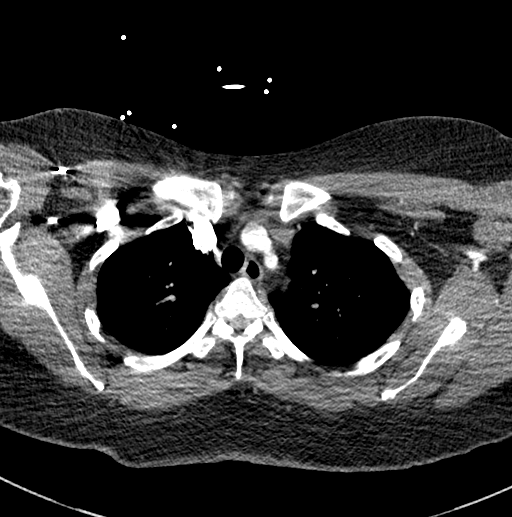
[im 120/360  bone]
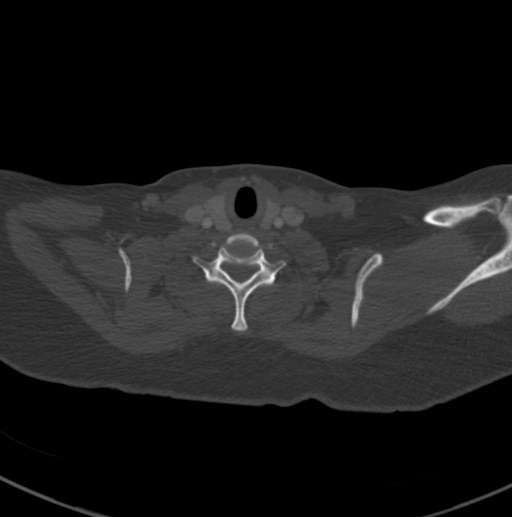
[im 180/360  soft-tissue]
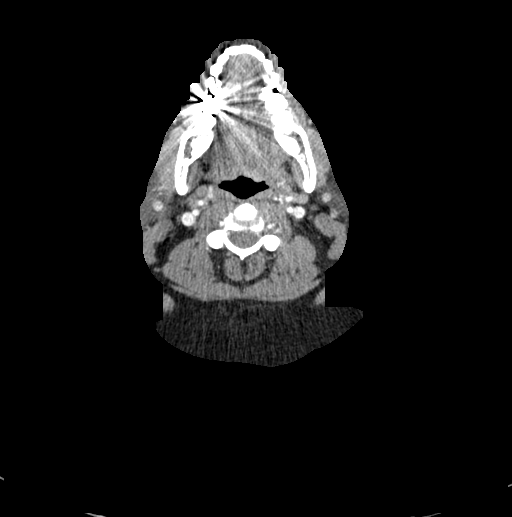
[im 240/360  bone]
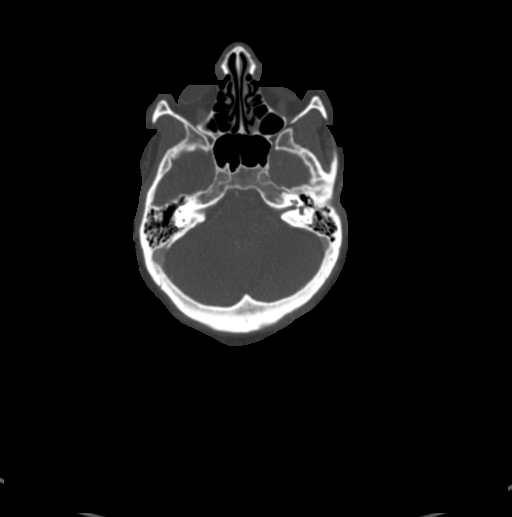
[im 300/360  soft-tissue]
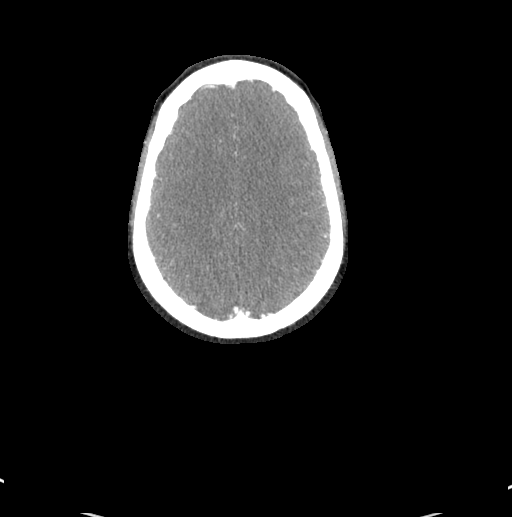

[5 of 34 positions shown; findings below may reference images not displayed]

FINDINGS: CT HEAD FINDINGS

Brain: There is no mass, hemorrhage or extra-axial collection. The
size and configuration of the ventricles and extra-axial CSF spaces
are normal. There is no acute or chronic infarction. The brain
parenchyma is normal.

Skull: The visualized skull base, calvarium and extracranial soft
tissues are normal.

Sinuses/Orbits: No fluid levels or advanced mucosal thickening of
the visualized paranasal sinuses. No mastoid or middle ear effusion.
The orbits are normal.

CTA NECK FINDINGS

SKELETON: There is no bony spinal canal stenosis. No lytic or
blastic lesion.

OTHER NECK: Normal pharynx, larynx and major salivary glands. No
cervical lymphadenopathy. Unremarkable thyroid gland.

UPPER CHEST: No pneumothorax or pleural effusion. No nodules or
masses.

AORTIC ARCH:

There is no calcific atherosclerosis of the aortic arch. There is no
aneurysm, dissection or hemodynamically significant stenosis of the
visualized portion of the aorta. Conventional 3 vessel aortic
branching pattern. The visualized proximal subclavian arteries are
widely patent.

RIGHT CAROTID SYSTEM: Normal without aneurysm, dissection or
stenosis.

LEFT CAROTID SYSTEM: Normal without aneurysm, dissection or
stenosis.

VERTEBRAL ARTERIES: Left dominant configuration. Both origins are
clearly patent. There is no dissection, occlusion or flow-limiting
stenosis to the skull base (V1-V3 segments).

CTA HEAD FINDINGS

POSTERIOR CIRCULATION:

--Vertebral arteries: Normal V4 segments.

--Inferior cerebellar arteries: Normal.

--Basilar artery: Normal.

--Superior cerebellar arteries: Normal.

--Posterior cerebral arteries (PCA): Normal.

ANTERIOR CIRCULATION:

--Intracranial internal carotid arteries: Normal.

--Anterior cerebral arteries (ACA): Normal. Both A1 segments are
present. Patent anterior communicating artery (a-comm).

--Middle cerebral arteries (MCA): Normal.

VENOUS SINUSES: As permitted by contrast timing, patent.

ANATOMIC VARIANTS: Fetal origin of the left posterior cerebral
artery.

Review of the MIP images confirms the above findings.
IMPRESSION: Normal CTA of the head and neck.
